# Patient Record
Sex: Female | Born: 1965 | Race: White | Hispanic: No | Marital: Married | Smoking: Current every day smoker
Health system: Southern US, Community
[De-identification: ages and names within clinical notes are randomized; demographics above are authoritative.]

## PROBLEM LIST (undated history)

## (undated) DIAGNOSIS — M549 Dorsalgia, unspecified: Secondary | ICD-10-CM

---

## 2014-11-19 ENCOUNTER — Observation Stay (HOSPITAL_BASED_OUTPATIENT_CLINIC_OR_DEPARTMENT_OTHER)
Admission: EM | Admit: 2014-11-19 | Discharge: 2014-11-21 | Disposition: A | Discharge status: Home / Self Care | Payer: Medicaid Other | Attending: General Surgery | Admitting: General Surgery

## 2014-11-19 ENCOUNTER — Emergency Department (HOSPITAL_BASED_OUTPATIENT_CLINIC_OR_DEPARTMENT_OTHER): Payer: Medicaid Other

## 2014-11-19 ENCOUNTER — Encounter (HOSPITAL_BASED_OUTPATIENT_CLINIC_OR_DEPARTMENT_OTHER): Payer: Self-pay | Admitting: Emergency Medicine

## 2014-11-19 DIAGNOSIS — R52 Pain, unspecified: Secondary | ICD-10-CM

## 2014-11-19 DIAGNOSIS — R1011 Right upper quadrant pain: Secondary | ICD-10-CM | POA: Diagnosis present

## 2014-11-19 DIAGNOSIS — K8010 Calculus of gallbladder with chronic cholecystitis without obstruction: Secondary | ICD-10-CM | POA: Diagnosis present

## 2014-11-19 DIAGNOSIS — K8020 Calculus of gallbladder without cholecystitis without obstruction: Secondary | ICD-10-CM

## 2014-11-19 DIAGNOSIS — F17210 Nicotine dependence, cigarettes, uncomplicated: Secondary | ICD-10-CM | POA: Insufficient documentation

## 2014-11-19 DIAGNOSIS — K819 Cholecystitis, unspecified: Secondary | ICD-10-CM

## 2014-11-19 HISTORY — DX: Dorsalgia, unspecified: M54.9

## 2014-11-19 LAB — PREGNANCY, URINE: Preg Test, Ur: NEGATIVE

## 2014-11-19 LAB — COMPREHENSIVE METABOLIC PANEL
ALBUMIN: 4.1 g/dL (ref 3.5–5.0)
ALK PHOS: 66 U/L (ref 38–126)
ALT: 19 U/L (ref 14–54)
ANION GAP: 8 (ref 5–15)
AST: 23 U/L (ref 15–41)
BUN: 6 mg/dL (ref 6–20)
CALCIUM: 9.3 mg/dL (ref 8.9–10.3)
CHLORIDE: 103 mmol/L (ref 101–111)
CO2: 26 mmol/L (ref 22–32)
CREATININE: 0.47 mg/dL (ref 0.44–1.00)
GFR calc non Af Amer: >60 mL/min (ref >60)
Glucose, Bld: 107 mg/dL — ABNORMAL HIGH (ref 65–99)
Potassium: 3.6 mmol/L (ref 3.5–5.1)
SODIUM: 137 mmol/L (ref 135–145)
Total Bilirubin: 0.6 mg/dL (ref 0.3–1.2)
Total Protein: 8.2 g/dL — ABNORMAL HIGH (ref 6.5–8.1)

## 2014-11-19 LAB — URINALYSIS, ROUTINE W REFLEX MICROSCOPIC
Bilirubin Urine: NEGATIVE
GLUCOSE, UA: NEGATIVE mg/dL
HGB URINE DIPSTICK: NEGATIVE
Ketones, ur: 15 mg/dL — AB
LEUKOCYTES UA: NEGATIVE
Nitrite: NEGATIVE
PH: 7.5 (ref 5.0–8.0)
Protein, ur: NEGATIVE mg/dL
Specific Gravity, Urine: 1.017 (ref 1.005–1.030)
Urobilinogen, UA: 1 mg/dL (ref 0.0–1.0)

## 2014-11-19 LAB — CBC
HCT: 40.8 % (ref 36.0–46.0)
HEMOGLOBIN: 13.7 g/dL (ref 12.0–15.0)
MCH: 31 pg (ref 26.0–34.0)
MCHC: 33.6 g/dL (ref 30.0–36.0)
MCV: 92.3 fL (ref 78.0–100.0)
Platelets: 371 10*3/uL (ref 150–400)
RBC: 4.42 MIL/uL (ref 3.87–5.11)
RDW: 12.8 % (ref 11.5–15.5)
WBC: 19.4 10*3/uL — ABNORMAL HIGH (ref 4.0–10.5)

## 2014-11-19 LAB — LIPASE, BLOOD: LIPASE: 18 U/L (ref 11–51)

## 2014-11-19 MED ORDER — KCL IN DEXTROSE-NACL 20-5-0.9 MEQ/L-%-% IV SOLN
INTRAVENOUS | Status: DC
  Administered 2014-11-19 – 2014-11-20 (×2): 100 mL/h via INTRAVENOUS
  Filled 2014-11-19 (×5): qty 1000

## 2014-11-19 MED ORDER — PANTOPRAZOLE SODIUM 40 MG IV SOLR
40.0000 mg | Freq: Every day | INTRAVENOUS | Status: DC
  Administered 2014-11-20 (×2): 40 mg via INTRAVENOUS
  Filled 2014-11-19 (×3): qty 40

## 2014-11-19 MED ORDER — HYDROMORPHONE HCL 1 MG/ML IJ SOLN
1.0000 mg | Freq: Once | INTRAMUSCULAR | Status: AC
  Administered 2014-11-19: 1 mg via INTRAVENOUS
  Filled 2014-11-19: qty 1

## 2014-11-19 MED ORDER — MORPHINE SULFATE (PF) 2 MG/ML IV SOLN
1.0000 mg | INTRAVENOUS | Status: DC | PRN
  Administered 2014-11-19: 2 mg via INTRAVENOUS
  Administered 2014-11-20: 1 mg via INTRAVENOUS
  Administered 2014-11-20: 2 mg via INTRAVENOUS
  Administered 2014-11-21 (×2): 1 mg via INTRAVENOUS
  Filled 2014-11-19 (×5): qty 1

## 2014-11-19 MED ORDER — PIPERACILLIN-TAZOBACTAM 3.375 G IVPB
INTRAVENOUS | Status: AC
  Filled 2014-11-19: qty 50

## 2014-11-19 MED ORDER — ONDANSETRON 4 MG PO TBDP: 4.0000 mg | ORAL_TABLET | Freq: Four times a day (QID) | ORAL | Status: DC | PRN

## 2014-11-19 MED ORDER — METRONIDAZOLE IN NACL 5-0.79 MG/ML-% IV SOLN
500.0000 mg | Freq: Once | INTRAVENOUS | Status: DC
  Filled 2014-11-19: qty 100

## 2014-11-19 MED ORDER — PIPERACILLIN-TAZOBACTAM 4.5 G IVPB
3.3750 g | Freq: Once | INTRAVENOUS | Status: AC
  Administered 2014-11-19: 3.375 g via INTRAVENOUS
  Filled 2014-11-19: qty 100

## 2014-11-19 MED ORDER — IOHEXOL 350 MG/ML SOLN
100.0000 mL | Freq: Once | INTRAVENOUS | Status: AC | PRN
  Administered 2014-11-19: 100 mL via INTRAVENOUS

## 2014-11-19 MED ORDER — PIPERACILLIN-TAZOBACTAM 4.5 G IVPB
4.5000 g | Freq: Once | INTRAVENOUS | Status: DC
  Filled 2014-11-19: qty 100

## 2014-11-19 MED ORDER — ONDANSETRON HCL 4 MG/2ML IJ SOLN
4.0000 mg | Freq: Once | INTRAMUSCULAR | Status: AC | PRN
  Administered 2014-11-19: 4 mg via INTRAVENOUS
  Filled 2014-11-19: qty 2

## 2014-11-19 MED ORDER — PIPERACILLIN-TAZOBACTAM 3.375 G IVPB
3.3750 g | Freq: Three times a day (TID) | INTRAVENOUS | Status: DC
  Administered 2014-11-20 – 2014-11-21 (×3): 3.375 g via INTRAVENOUS
  Filled 2014-11-19 (×5): qty 50

## 2014-11-19 MED ORDER — ONDANSETRON HCL 4 MG/2ML IJ SOLN
4.0000 mg | Freq: Once | INTRAMUSCULAR | Status: DC
  Filled 2014-11-19: qty 2

## 2014-11-19 MED ORDER — PROMETHAZINE HCL 25 MG/ML IJ SOLN
12.5000 mg | Freq: Once | INTRAMUSCULAR | Status: AC
  Administered 2014-11-19: 12.5 mg via INTRAVENOUS
  Filled 2014-11-19: qty 1

## 2014-11-19 MED ORDER — ONDANSETRON HCL 4 MG/2ML IJ SOLN: 4.0000 mg | Freq: Four times a day (QID) | INTRAMUSCULAR | Status: DC | PRN

## 2014-11-19 MED ORDER — MORPHINE SULFATE (PF) 4 MG/ML IV SOLN
4.0000 mg | Freq: Once | INTRAVENOUS | Status: AC
  Administered 2014-11-19: 4 mg via INTRAVENOUS
  Filled 2014-11-19: qty 1

## 2014-11-19 MED ORDER — SODIUM CHLORIDE 0.9 % IV BOLUS (SEPSIS)
1000.0000 mL | Freq: Once | INTRAVENOUS | Status: AC
  Administered 2014-11-19: 1000 mL via INTRAVENOUS

## 2014-11-19 MED ORDER — CIPROFLOXACIN IN D5W 400 MG/200ML IV SOLN
400.0000 mg | Freq: Two times a day (BID) | INTRAVENOUS | Status: DC
  Filled 2014-11-19: qty 200

## 2014-11-19 NOTE — ED Notes (Signed)
Pt reports acute onset of abdominal pain that started last night, states she has vomited x 10 last bm yesterday am

## 2014-11-19 NOTE — ED Notes (Signed)
Called pt's husband Claud at [phone] to update him on pt's transfer and where to go once he gets to [facility].

## 2014-11-19 NOTE — H&P (Signed)
Janice Edwards is an 49 y.o. female.   Chief Complaint: abdominal pain HPI:  The patient is a 49 year old white female who presents with a 1 day of abdominal pain. The pain is located in the right upper quadrant and epigastric region. The pain has been constant. The pain has been associated with significant nausea and vomiting. She went to the emergency department where a CT and ultrasound showed a 2 cm stone in the gallbladder neck with changes of cholecystitis. She does smoke.  Past Medical History  Diagnosis Date  . Back pain     History reviewed. No pertinent past surgical history.  History reviewed. No pertinent family history. Social History:  reports that she has been smoking Cigarettes.  She has been smoking about 1.00 pack per day. She does not have any smokeless tobacco history on file. She reports that she does not drink alcohol. Her drug history is not on file.  Allergies: No Known Allergies   (Not in a hospital admission)  Results for orders placed or performed during the hospital encounter of 11/19/14 (from the past 48 hour(s))  Urinalysis, Routine w reflex microscopic (not at [facility])     Status: Abnormal   Collection Time: 11/19/14 12:20 PM  Result Value Ref Range   Color, Urine YELLOW YELLOW   APPearance CLOUDY (A) CLEAR   Specific Gravity, Urine 1.017 1.005 - 1.030   pH 7.5 5.0 - 8.0   Glucose, UA NEGATIVE NEGATIVE mg/dL   Hgb urine dipstick NEGATIVE NEGATIVE   Bilirubin Urine NEGATIVE NEGATIVE   Ketones, ur 15 (A) NEGATIVE mg/dL   Protein, ur NEGATIVE NEGATIVE mg/dL   Urobilinogen, UA 1.0 0.0 - 1.0 mg/dL   Nitrite NEGATIVE NEGATIVE   Leukocytes, UA NEGATIVE NEGATIVE    Comment: MICROSCOPIC NOT DONE ON URINES WITH NEGATIVE PROTEIN, BLOOD, LEUKOCYTES, NITRITE, OR GLUCOSE <1000 mg/dL.  Pregnancy, urine     Status: None   Collection Time: 11/19/14 12:20 PM  Result Value Ref Range   Preg Test, Ur NEGATIVE NEGATIVE    Comment:        THE SENSITIVITY OF  THIS METHODOLOGY IS >20 mIU/mL.   Lipase, blood     Status: None   Collection Time: 11/19/14  1:52 PM  Result Value Ref Range   Lipase 18 11 - 51 U/L    Comment: Please note change in reference range.  Comprehensive metabolic panel     Status: Abnormal   Collection Time: 11/19/14  1:52 PM  Result Value Ref Range   Sodium 137 135 - 145 mmol/L   Potassium 3.6 3.5 - 5.1 mmol/L   Chloride 103 101 - 111 mmol/L   CO2 26 22 - 32 mmol/L   Glucose, Bld 107 (H) 65 - 99 mg/dL   BUN 6 6 - 20 mg/dL   Creatinine, Ser 0.47 0.44 - 1.00 mg/dL   Calcium 9.3 8.9 - 10.3 mg/dL   Total Protein 8.2 (H) 6.5 - 8.1 g/dL   Albumin 4.1 3.5 - 5.0 g/dL   AST 23 15 - 41 U/L   ALT 19 14 - 54 U/L   Alkaline Phosphatase 66 38 - 126 U/L   Total Bilirubin 0.6 0.3 - 1.2 mg/dL   GFR calc non Af Amer >60 >60 mL/min   GFR calc Af Amer >60 >60 mL/min    Comment: (NOTE) The eGFR has been calculated using the CKD EPI equation. This calculation has not been validated in all clinical situations. eGFR's persistently <60 mL/min signify possible Chronic  Kidney Disease.    Anion gap 8 5 - 15  CBC     Status: Abnormal   Collection Time: 11/19/14  1:52 PM  Result Value Ref Range   WBC 19.4 (H) 4.0 - 10.5 K/uL   RBC 4.42 3.87 - 5.11 MIL/uL   Hemoglobin 13.7 12.0 - 15.0 g/dL   HCT 40.8 36.0 - 46.0 %   MCV 92.3 78.0 - 100.0 fL   MCH 31.0 26.0 - 34.0 pg   MCHC 33.6 30.0 - 36.0 g/dL   RDW 12.8 11.5 - 15.5 %   Platelets 371 150 - 400 K/uL   US Abdomen Complete  11/19/2014  CLINICAL DATA:  Epigastric pain radiating to right upper quadrant for 1 day with nausea and vomiting. EXAM: ULTRASOUND ABDOMEN COMPLETE COMPARISON:  CT from earlier same day. FINDINGS: Gallbladder: There is a large gallstone, measuring 2 cm greatest dimension, that appears to be fixed in the gallbladder neck region. Gallbladder is distended and there is pericholecystic edema and gallbladder wall thickening indicating acute cholecystitis. Sonographic  Murphy's sign was elicited by the sonographer during the exam. Common bile duct: Diameter: Common bile duct not well seen. Common bile duct caliber is normal where seen at 4 mm. Liver: Liver is diffusely echogenic consistent with fatty infiltration. No focal mass or lesion identified within the liver. Main portal vein is shown to be patent with appropriate hepatopetal directional blood flow. IVC: No abnormality visualized. Pancreas: Limited visualization.  Pancreatic head appears normal. Spleen: Size and appearance within normal limits. Right Kidney: Length: 9.8 cm. Echogenicity within normal limits. No mass or hydronephrosis visualized. Left Kidney: Length: 10.2 cm. Echogenicity within normal limits. No mass or hydronephrosis visualized. Abdominal aorta: Limited visualization due to overlying bowel gas. Normal in caliber were seen. Other findings: None. IMPRESSION: 1. Findings consistent with acute cholecystitis. 2 cm gallstone fixed in the gallbladder neck region. Gallbladder is distended and there is gallbladder wall thickening and pericholecystic edema. 2. Fatty infiltration of the liver. 3. Limited visualization the pancreas and abdominal aorta. These results were called by telephone at the time of interpretation on 11/19/2014 at 6:27 pm to Dr. Donnald Garre , who verbally acknowledged these results. Electronically Signed   By: Franki Cabot M.D.   On: 11/19/2014 18:27   Ct Angio Chest Aorta W/cm &/or Wo/cm  11/19/2014  CLINICAL DATA:  Sharp epigastric abdominal and back pain. EXAM: CT ANGIOGRAPHY CHEST, ABDOMEN AND PELVIS TECHNIQUE: Multidetector CT imaging through the chest, abdomen and pelvis was performed using the standard protocol during bolus administration of intravenous contrast. Multiplanar reconstructed images and MIPs were obtained and reviewed to evaluate the vascular anatomy. CONTRAST:  171m OMNIPAQUE IOHEXOL 350 MG/ML SOLN COMPARISON:  None. FINDINGS: CTA CHEST FINDINGS No pneumothorax or  pleural effusion is noted. No acute pulmonary disease is noted. There is no evidence of thoracic aortic dissection or aneurysm. Great vessels are widely patent. There is no definite evidence of pulmonary embolus. No mediastinal mass or adenopathy is noted. No significant osseous abnormality is noted. Review of the MIP images confirms the above findings. CTA ABDOMEN AND PELVIS FINDINGS Fatty infiltration of the liver is noted with sparing around the gallbladder fossa. Large solitary gallstone is noted in neck of gallbladder. The spleen and pancreas appear normal. Adrenal glands and kidneys appear normal. No hydronephrosis or renal obstruction is noted. There is no evidence of bowel obstruction. There is no evidence of abdominal aortic aneurysm or dissection. Mesenteric and renal arteries are widely patent. Visualized iliac arteries  are patent. No abnormal fluid collection is noted. Urinary bladder appears normal. Uterus and ovaries appear normal. No significant adenopathy is noted. Review of the MIP images confirms the above findings. IMPRESSION: No evidence of thoracic or abdominal aortic aneurysm or dissection. Fatty infiltration of the liver is noted. Large solitary gallstone is noted in the neck of the gallbladder. Electronically Signed   By: Marijo Conception, M.D.   On: 11/19/2014 17:02   Ct Angio Abd/pel W/ And/or W/o  11/19/2014  CLINICAL DATA:  Sharp epigastric abdominal and back pain. EXAM: CT ANGIOGRAPHY CHEST, ABDOMEN AND PELVIS TECHNIQUE: Multidetector CT imaging through the chest, abdomen and pelvis was performed using the standard protocol during bolus administration of intravenous contrast. Multiplanar reconstructed images and MIPs were obtained and reviewed to evaluate the vascular anatomy. CONTRAST:  168m OMNIPAQUE IOHEXOL 350 MG/ML SOLN COMPARISON:  None. FINDINGS: CTA CHEST FINDINGS No pneumothorax or pleural effusion is noted. No acute pulmonary disease is noted. There is no evidence of  thoracic aortic dissection or aneurysm. Great vessels are widely patent. There is no definite evidence of pulmonary embolus. No mediastinal mass or adenopathy is noted. No significant osseous abnormality is noted. Review of the MIP images confirms the above findings. CTA ABDOMEN AND PELVIS FINDINGS Fatty infiltration of the liver is noted with sparing around the gallbladder fossa. Large solitary gallstone is noted in neck of gallbladder. The spleen and pancreas appear normal. Adrenal glands and kidneys appear normal. No hydronephrosis or renal obstruction is noted. There is no evidence of bowel obstruction. There is no evidence of abdominal aortic aneurysm or dissection. Mesenteric and renal arteries are widely patent. Visualized iliac arteries are patent. No abnormal fluid collection is noted. Urinary bladder appears normal. Uterus and ovaries appear normal. No significant adenopathy is noted. Review of the MIP images confirms the above findings. IMPRESSION: No evidence of thoracic or abdominal aortic aneurysm or dissection. Fatty infiltration of the liver is noted. Large solitary gallstone is noted in the neck of the gallbladder. Electronically Signed   By: JMarijo Conception M.D.   On: 11/19/2014 17:02    Review of Systems  Constitutional: Negative.   HENT: Negative.   Eyes: Negative.   Respiratory: Negative.   Cardiovascular: Negative.   Gastrointestinal: Positive for nausea, vomiting and abdominal pain.  Genitourinary: Negative.   Musculoskeletal: Positive for back pain.  Skin: Negative.   Neurological: Negative.   Endo/Heme/Allergies: Negative.   Psychiatric/Behavioral: Negative.     Blood pressure 103/58, pulse 66, temperature 98.6 F (37 C), temperature source Oral, resp. rate 20, height '5\' 6"'  (1.676 m), weight 54.885 kg (121 lb), SpO2 94 %. Physical Exam  Constitutional: She is oriented to person, place, and time. She appears well-developed and well-nourished.  HENT:  Head:  Normocephalic and atraumatic.  Eyes: Conjunctivae and EOM are normal. Pupils are equal, round, and reactive to light.  Neck: Normal range of motion. Neck supple.  Cardiovascular: Normal rate, regular rhythm and normal heart sounds.   Respiratory: Effort normal and breath sounds normal.  GI: Soft.  There is RUQ and epigastric tenderness  Musculoskeletal: Normal range of motion.  Neurological: She is alert and oriented to person, place, and time.  Skin: Skin is warm and dry.  Psychiatric: She has a normal mood and affect. Her behavior is normal.     Assessment/Plan  The patient appears to have symptomatic cholelithiasis with cholecystitis. Because of the risk of further painful episodes and possible pancreatitis setting she would benefit from having  her gallbladder removed. I have discussed with her in detail the risks and benefits of the operation to remove the gallbladder as well as some of the technical aspects including the risk of injury to the common bile duct and she understands and wishes to proceed  TOTH III,PAUL S 11/19/2014, 9:09 PM

## 2014-11-19 NOTE — ED Notes (Signed)
Attempted to call [facility] to get medication list, they closed at 1300. Unable to obtain medications patient is taking

## 2014-11-19 NOTE — ED Notes (Signed)
Pt vomited x2, RN notified

## 2014-11-19 NOTE — Progress Notes (Signed)
ANTIBIOTIC CONSULT NOTE - INITIAL  Pharmacy Consult for cipro Indication: acute cholecystitis  No Known Allergies  Patient Measurements: Height: 5\' 6"  (167.6 cm) Weight: 121 lb (54.885 kg) IBW/kg (Calculated) : 59.3 Adjusted Body Weight:   Vital Signs: Temp: 98.4 F (36.9 C) (10/22 1223) Temp Source: Oral (10/22 1223) BP: 100/54 mmHg (10/22 1730) Pulse Rate: 90 (10/22 1730) Intake/Output from previous day:   Intake/Output from this shift:    Labs:  Recent Labs  11/19/14 1352  WBC 19.4*  HGB 13.7  PLT 371  CREATININE 0.47   Estimated Creatinine Clearance: 76.2 mL/min (by C-G formula based on Cr of 0.47). No results for input(s): VANCOTROUGH, VANCOPEAK, VANCORANDOM, GENTTROUGH, GENTPEAK, GENTRANDOM, TOBRATROUGH, TOBRAPEAK, TOBRARND, AMIKACINPEAK, AMIKACINTROU, AMIKACIN in the last 72 hours.   Microbiology: No results found for this or any previous visit (from the past 720 hour(s)).  Medical History: Past Medical History  Diagnosis Date  . Back pain     Medications:  Scheduled:   Infusions:  . metronidazole     Assessment: 49 yo female with acute cholecystitis will be started on cipro.  CrCl 76  Goal of Therapy:  Resolution of infection  Plan:  - cipro 400 mg iv q12h  Ikechukwu Cerny, Tsz-Yin 11/19/2014,6:34 PM

## 2014-11-19 NOTE — ED Provider Notes (Signed)
CSN: [id number]     Arrival date & time 11/19/14  1219 History   First MD Initiated Contact with Patient 11/19/14 1334     Chief Complaint  Patient presents with  . Abdominal Pain     (Consider location/radiation/quality/duration/timing/severity/associated sxs/prior Treatment) HPI  Janice Edwards is a 49 y.o F with a pmhx of chronic back pain who presents the emergency department today complaining of upper abdominal pain radiating to her back onset yesterday. Patient states that she was resting on her couch yesterday when she felt sudden onset back pain in her thoracic region that is now traveled to her upper abdomen including right upper and left upper quadrant. Patient also complaining of nausea and several episodes of emesis since since last night, NBNB. Associated symptoms include chills, sweating. Patient also complaining of a "funny sensation in her feet". No aggravating or alleviating factors. Denies numbness, weakness, fever. Denies alcohol use.  Past Medical History  Diagnosis Date  . Back pain    History reviewed. No pertinent past surgical history. History reviewed. No pertinent family history. Social History  Substance Use Topics  . Smoking status: Current Every Day Smoker -- 1.00 packs/day    Types: Cigarettes  . Smokeless tobacco: None  . Alcohol Use: No   OB History    No data available     Review of Systems  All other systems reviewed and are negative.     Allergies  Review of patient's allergies indicates no known allergies.  Home Medications   Prior to Admission medications   Not on File   BP 100/54 mmHg  Pulse 90  Temp(Src) 98.4 F (36.9 C) (Oral)  Resp 20  Ht  (1.676 m)  Wt 121 lb (54.885 kg)  BMI 19.54 kg/m2  SpO2 95% Physical Exam  Constitutional: She is oriented to person, place, and time. She appears well-developed and well-nourished. She appears distressed.  Ill-appearing female lying in bed. Patient is trembling. Complaining of  pain in her abdomen.  HENT:  Head: Normocephalic and atraumatic.  Mouth/Throat: Oropharynx is clear and moist. No oropharyngeal exudate.  Eyes: Conjunctivae and EOM are normal. Pupils are equal, round, and reactive to light. Right eye exhibits no discharge. Left eye exhibits no discharge. No scleral icterus.  Neck: Normal range of motion. Neck supple.  Cardiovascular: Normal rate, regular rhythm, normal heart sounds and intact distal pulses.  Exam reveals no gallop and no friction rub.   No murmur heard. Pulmonary/Chest: Effort normal and breath sounds normal. No respiratory distress. She has no wheezes. She has no rales. She exhibits no tenderness.  Abdominal: Soft. Bowel sounds are normal. She exhibits no distension. There is tenderness. There is guarding.  Exquisite tenderness to palpation of right upper quadrant, epigastrium and left upper quadrant. Positive Murphy sign.  Musculoskeletal: Normal range of motion. She exhibits no edema or tenderness.  Lymphadenopathy:    She has no cervical adenopathy.  Neurological: She is alert and oriented to person, place, and time. No cranial nerve deficit.  Strength 5/5 throughout. No sensory deficits.  No gait abnormal.  Skin: Skin is warm. No rash noted. She is diaphoretic. No erythema. No pallor.  Psychiatric: She has a normal mood and affect.  Nursing note and vitals reviewed.   ED Course  Procedures (including critical care time) Labs Review Labs Reviewed  URINALYSIS, ROUTINE W REFLEX MICROSCOPIC (NOT AT [facility]) - Abnormal; Notable for the following:    APPearance CLOUDY (*)    Ketones, ur 15 (*)  All other components within normal limits  COMPREHENSIVE METABOLIC PANEL - Abnormal; Notable for the following:    Glucose, Bld 107 (*)    Total Protein 8.2 (*)    All other components within normal limits  CBC - Abnormal; Notable for the following:    WBC 19.4 (*)    All other components within normal limits  LIPASE, BLOOD  PREGNANCY,  URINE    Imaging Review US Abdomen Complete  11/19/2014  CLINICAL DATA:  Epigastric pain radiating to right upper quadrant for 1 day with nausea and vomiting. EXAM: ULTRASOUND ABDOMEN COMPLETE COMPARISON:  CT from earlier same day. FINDINGS: Gallbladder: There is a large gallstone, measuring 2 cm greatest dimension, that appears to be fixed in the gallbladder neck region. Gallbladder is distended and there is pericholecystic edema and gallbladder wall thickening indicating acute cholecystitis. Sonographic Murphy's sign was elicited by the sonographer during the exam. Common bile duct: Diameter: Common bile duct not well seen. Common bile duct caliber is normal where seen at 4 mm. Liver: Liver is diffusely echogenic consistent with fatty infiltration. No focal mass or lesion identified within the liver. Main portal vein is shown to be patent with appropriate hepatopetal directional blood flow. IVC: No abnormality visualized. Pancreas: Limited visualization.  Pancreatic head appears normal. Spleen: Size and appearance within normal limits. Right Kidney: Length: 9.8 cm. Echogenicity within normal limits. No mass or hydronephrosis visualized. Left Kidney: Length: 10.2 cm. Echogenicity within normal limits. No mass or hydronephrosis visualized. Abdominal aorta: Limited visualization due to overlying bowel gas. Normal in caliber were seen. Other findings: None. IMPRESSION: 1. Findings consistent with acute cholecystitis. 2 cm gallstone fixed in the gallbladder neck region. Gallbladder is distended and there is gallbladder wall thickening and pericholecystic edema. 2. Fatty infiltration of the liver. 3. Limited visualization the pancreas and abdominal aorta. These results were called by telephone at the time of interpretation on 11/19/2014 at 6:27 pm to Dr. Gaylyn Rong , who verbally acknowledged these results. Electronically Signed   By: Bary Richard M.D.   On: 11/19/2014 18:27   Ct Angio Chest Aorta W/cm  &/or Wo/cm  11/19/2014  CLINICAL DATA:  Sharp epigastric abdominal and back pain. EXAM: CT ANGIOGRAPHY CHEST, ABDOMEN AND PELVIS TECHNIQUE: Multidetector CT imaging through the chest, abdomen and pelvis was performed using the standard protocol during bolus administration of intravenous contrast. Multiplanar reconstructed images and MIPs were obtained and reviewed to evaluate the vascular anatomy. CONTRAST:  OMNIPAQUE IOHEXOL 350 MG/ML SOLN COMPARISON:  None. FINDINGS: CTA CHEST FINDINGS No pneumothorax or pleural effusion is noted. No acute pulmonary disease is noted. There is no evidence of thoracic aortic dissection or aneurysm. Great vessels are widely patent. There is no definite evidence of pulmonary embolus. No mediastinal mass or adenopathy is noted. No significant osseous abnormality is noted. Review of the MIP images confirms the above findings. CTA ABDOMEN AND PELVIS FINDINGS Fatty infiltration of the liver is noted with sparing around the gallbladder fossa. Large solitary gallstone is noted in neck of gallbladder. The spleen and pancreas appear normal. Adrenal glands and kidneys appear normal. No hydronephrosis or renal obstruction is noted. There is no evidence of bowel obstruction. There is no evidence of abdominal aortic aneurysm or dissection. Mesenteric and renal arteries are widely patent. Visualized iliac arteries are patent. No abnormal fluid collection is noted. Urinary bladder appears normal. Uterus and ovaries appear normal. No significant adenopathy is noted. Review of the MIP images confirms the above findings. IMPRESSION: No evidence  of thoracic or abdominal aortic aneurysm or dissection. Fatty infiltration of the liver is noted. Large solitary gallstone is noted in the neck of the gallbladder. Electronically Signed   By: Lupita RaiderJames  Green Jr, M.D.   On: 11/19/2014 17:02   Ct Angio Abd/pel W/ And/or W/o  11/19/2014  CLINICAL DATA:  Sharp epigastric abdominal and back pain. EXAM: CT  ANGIOGRAPHY CHEST, ABDOMEN AND PELVIS TECHNIQUE: Multidetector CT imaging through the chest, abdomen and pelvis was performed using the standard protocol during bolus administration of intravenous contrast. Multiplanar reconstructed images and MIPs were obtained and reviewed to evaluate the vascular anatomy. CONTRAST:  100mL OMNIPAQUE IOHEXOL 350 MG/ML SOLN COMPARISON:  None. FINDINGS: CTA CHEST FINDINGS No pneumothorax or pleural effusion is noted. No acute pulmonary disease is noted. There is no evidence of thoracic aortic dissection or aneurysm. Great vessels are widely patent. There is no definite evidence of pulmonary embolus. No mediastinal mass or adenopathy is noted. No significant osseous abnormality is noted. Review of the MIP images confirms the above findings. CTA ABDOMEN AND PELVIS FINDINGS Fatty infiltration of the liver is noted with sparing around the gallbladder fossa. Large solitary gallstone is noted in neck of gallbladder. The spleen and pancreas appear normal. Adrenal glands and kidneys appear normal. No hydronephrosis or renal obstruction is noted. There is no evidence of bowel obstruction. There is no evidence of abdominal aortic aneurysm or dissection. Mesenteric and renal arteries are widely patent. Visualized iliac arteries are patent. No abnormal fluid collection is noted. Urinary bladder appears normal. Uterus and ovaries appear normal. No significant adenopathy is noted. Review of the MIP images confirms the above findings. IMPRESSION: No evidence of thoracic or abdominal aortic aneurysm or dissection. Fatty infiltration of the liver is noted. Large solitary gallstone is noted in the neck of the gallbladder. Electronically Signed   By: Lupita RaiderJames  Green Jr, M.D.   On: 11/19/2014 17:02   I have personally reviewed and evaluated these images and lab results as part of my medical decision-making.   EKG Interpretation None      MDM   Final diagnoses:  Cholecystitis    Otherwise  healthy 49 year old female complaining of acute onset back pain that has radiated to her upper abdomen. Afebrile. Exquisitely tender abdominal exam. Patient is trembling. Due to nature of pain and appearance of patient concern for abdominal aortic dissection. We will order a CT angiogram.  Patient given IV fluids and pain medication as well as nausea medication.  CT angiogram reveals no dissection however there is a large solitary gallstone in the neck of the gallbladder. Right upper quadrant ultrasound ordered  Blood labs have not resulted. WBC count 19,000.   Right upper quadrant ultrasound reveals a 2 cm gallstone fixed in the gallbladder neck region gallbladder is distended with wall thickening and pericholecystic edema. Consistent with acute cholecystitis.  Liver function within normal limits bilirubin is not elevated. No jaundice.  Spoke with general surgery [facility]. Recommend [facility] department and patient will be accepted by the general surgeon for cholecystectomy. Recommend IV Zosyn.  Sp[facility] with emergency physician [facility] Adri[facility] Simasook [facility] who [facility] patient.  Arrange transfer with [facility].  Patient was discussed with and seen by Dr. Deretha EmoryZackowski as well as Dr. Loretha StaplerWofford who agrees with the treatment plan.      Lester KinsmanSamantha Tripp [place] [facility], PA-C 11/19/14 1947  Blake DivineJohn Wofford, MD 11/20/14 [phone]

## 2014-11-19 NOTE — ED Provider Notes (Signed)
Medical screening examination/treatment/procedure(s) were conducted as a shared visit with non-physician practitioner(s) and myself.  I personally evaluated the patient during the encounter.   EKG Interpretation None      Results for orders placed or performed during the hospital encounter of 11/19/14  Urinalysis, Routine w reflex microscopic (not at [facility])  Result Value Ref Range   Color, Urine YELLOW YELLOW   APPearance CLOUDY (A) CLEAR   Specific Gravity, Urine 1.017 1.005 - 1.030   pH 7.5 5.0 - 8.0   Glucose, UA NEGATIVE NEGATIVE mg/dL   Hgb urine dipstick NEGATIVE NEGATIVE   Bilirubin Urine NEGATIVE NEGATIVE   Ketones, ur 15 (A) NEGATIVE mg/dL   Protein, ur NEGATIVE NEGATIVE mg/dL   Urobilinogen, UA 1.0 0.0 - 1.0 mg/dL   Nitrite NEGATIVE NEGATIVE   Leukocytes, UA NEGATIVE NEGATIVE  Lipase, blood  Result Value Ref Range   Lipase 18 11 - 51 U/L  Comprehensive metabolic panel  Result Value Ref Range   Sodium 137 135 - 145 mmol/L   Potassium 3.6 3.5 - 5.1 mmol/L   Chloride 103 101 - 111 mmol/L   CO2 26 22 - 32 mmol/L   Glucose, Bld 107 (H) 65 - 99 mg/dL   BUN 6 6 - 20 mg/dL   Creatinine, Ser 1.61 0.44 - 1.00 mg/dL   Calcium 9.3 8.9 - 09.6 mg/dL   Total Protein 8.2 (H) 6.5 - 8.1 g/dL   Albumin 4.1 3.5 - 5.0 g/dL   AST 23 15 - 41 U/L   ALT 19 14 - 54 U/L   Alkaline Phosphatase 66 38 - 126 U/L   Total Bilirubin 0.6 0.3 - 1.2 mg/dL   GFR calc non Af Amer >60 >60 mL/min   GFR calc Af Amer >60 >60 mL/min   Anion gap 8 5 - 15  CBC  Result Value Ref Range   WBC 19.4 (H) 4.0 - 10.5 K/uL   RBC 4.42 3.87 - 5.11 MIL/uL   Hemoglobin 13.7 12.0 - 15.0 g/dL   HCT 04.5 40.9 - 81.1 %   MCV 92.3 78.0 - 100.0 fL   MCH 31.0 26.0 - 34.0 pg   MCHC 33.6 30.0 - 36.0 g/dL   RDW 91.4 78.2 - 95.6 %   Platelets 371 150 - 400 K/uL  Pregnancy, urine  Result Value Ref Range   Preg Test, Ur NEGATIVE NEGATIVE   US Abdomen Complete  11/19/2014  CLINICAL DATA:  Epigastric pain radiating to  right upper quadrant for 1 day with nausea and vomiting. EXAM: ULTRASOUND ABDOMEN COMPLETE COMPARISON:  CT from earlier same day. FINDINGS: Gallbladder: There is a large gallstone, measuring 2 cm greatest dimension, that appears to be fixed in the gallbladder neck region. Gallbladder is distended and there is pericholecystic edema and gallbladder wall thickening indicating acute cholecystitis. Sonographic Murphy's sign was elicited by the sonographer during the exam. Common bile duct: Diameter: Common bile duct not well seen. Common bile duct caliber is normal where seen at 4 mm. Liver: Liver is diffusely echogenic consistent with fatty infiltration. No focal mass or lesion identified within the liver. Main portal vein is shown to be patent with appropriate hepatopetal directional blood flow. IVC: No abnormality visualized. Pancreas: Limited visualization.  Pancreatic head appears normal. Spleen: Size and appearance within normal limits. Right Kidney: Length: 9.8 cm. Echogenicity within normal limits. No mass or hydronephrosis visualized. Left Kidney: Length: 10.2 cm. Echogenicity within normal limits. No mass or hydronephrosis visualized. Abdominal aorta: Limited visualization due to overlying  bowel gas. Normal in caliber were seen. Other findings: None. IMPRESSION: 1. Findings consistent with acute cholecystitis. 2 cm gallstone fixed in the gallbladder neck region. Gallbladder is distended and there is gallbladder wall thickening and pericholecystic edema. 2. Fatty infiltration of the liver. 3. Limited visualization the pancreas and abdominal aorta. These results were called by telephone at the time of interpretation on 11/19/2014 at 6:27 pm to Dr. Gaylyn RongSAMANTHA DOWLESS , who verbally acknowledged these results. Electronically Signed   By: Bary RichardStan  Maynard M.D.   On: 11/19/2014 18:27   Ct Angio Chest Aorta W/cm &/or Wo/cm  11/19/2014  CLINICAL DATA:  Sharp epigastric abdominal and back pain. EXAM: CT ANGIOGRAPHY  CHEST, ABDOMEN AND PELVIS TECHNIQUE: Multidetector CT imaging through the chest, abdomen and pelvis was performed using the standard protocol during bolus administration of intravenous contrast. Multiplanar reconstructed images and MIPs were obtained and reviewed to evaluate the vascular anatomy. CONTRAST:  100mL OMNIPAQUE IOHEXOL 350 MG/ML SOLN COMPARISON:  None. FINDINGS: CTA CHEST FINDINGS No pneumothorax or pleural effusion is noted. No acute pulmonary disease is noted. There is no evidence of thoracic aortic dissection or aneurysm. Great vessels are widely patent. There is no definite evidence of pulmonary embolus. No mediastinal mass or adenopathy is noted. No significant osseous abnormality is noted. Review of the MIP images confirms the above findings. CTA ABDOMEN AND PELVIS FINDINGS Fatty infiltration of the liver is noted with sparing around the gallbladder fossa. Large solitary gallstone is noted in neck of gallbladder. The spleen and pancreas appear normal. Adrenal glands and kidneys appear normal. No hydronephrosis or renal obstruction is noted. There is no evidence of bowel obstruction. There is no evidence of abdominal aortic aneurysm or dissection. Mesenteric and renal arteries are widely patent. Visualized iliac arteries are patent. No abnormal fluid collection is noted. Urinary bladder appears normal. Uterus and ovaries appear normal. No significant adenopathy is noted. Review of the MIP images confirms the above findings. IMPRESSION: No evidence of thoracic or abdominal aortic aneurysm or dissection. Fatty infiltration of the liver is noted. Large solitary gallstone is noted in the neck of the gallbladder. Electronically Signed   By: Lupita RaiderJames  Green Jr, M.D.   On: 11/19/2014 17:02   Ct Angio Abd/pel W/ And/or W/o  11/19/2014  CLINICAL DATA:  Sharp epigastric abdominal and back pain. EXAM: CT ANGIOGRAPHY CHEST, ABDOMEN AND PELVIS TECHNIQUE: Multidetector CT imaging through the chest, abdomen and  pelvis was performed using the standard protocol during bolus administration of intravenous contrast. Multiplanar reconstructed images and MIPs were obtained and reviewed to evaluate the vascular anatomy. CONTRAST:  100mL OMNIPAQUE IOHEXOL 350 MG/ML SOLN COMPARISON:  None. FINDINGS: CTA CHEST FINDINGS No pneumothorax or pleural effusion is noted. No acute pulmonary disease is noted. There is no evidence of thoracic aortic dissection or aneurysm. Great vessels are widely patent. There is no definite evidence of pulmonary embolus. No mediastinal mass or adenopathy is noted. No significant osseous abnormality is noted. Review of the MIP images confirms the above findings. CTA ABDOMEN AND PELVIS FINDINGS Fatty infiltration of the liver is noted with sparing around the gallbladder fossa. Large solitary gallstone is noted in neck of gallbladder. The spleen and pancreas appear normal. Adrenal glands and kidneys appear normal. No hydronephrosis or renal obstruction is noted. There is no evidence of bowel obstruction. There is no evidence of abdominal aortic aneurysm or dissection. Mesenteric and renal arteries are widely patent. Visualized iliac arteries are patent. No abnormal fluid collection is noted. Urinary bladder appears normal.  Uterus and ovaries appear normal. No significant adenopathy is noted. Review of the MIP images confirms the above findings. IMPRESSION: No evidence of thoracic or abdominal aortic aneurysm or dissection. Fatty infiltration of the liver is noted. Large solitary gallstone is noted in the neck of the gallbladder. Electronically Signed   By: Lupita Raider, M.D.   On: 11/19/2014 17:02    Patient with complaint of epigastric right upper quadrant pain discomfort starting yesterday morning. Radiating to the back. Extensive workup shows evidence of a large stone in the neck of the gallbladder with thickening of the gallbladder wall consistent with cholecystitis. Common bile duct is not well  visualized on the ultrasound. Patient's liver function tests without elevation in lipase or total bilirubin. Neither suggestive of a retained duct stone.  Patient is still complaining of pain is still significant with tender in the right upper quadrant. Patient will require admission to general surgery for acute cholecystitis. Patient with significant leukocytosis. No fevers.  Vanetta Mulders, MD 11/19/14 [phone]

## 2014-11-19 NOTE — ED Notes (Signed)
Bed: [id number] Expected date:  Expected time:  Means of arrival:  Comments: Transfer from med center  Gallstones

## 2014-11-19 NOTE — ED Notes (Signed)
Pt with history of chronic back pain, for which she [facility]'s [facility] [place] [facility], pt states she takes 3 different medications for pain but she is unaware of their names and she is unaware of name of pharmacy that she uses in [place]

## 2014-11-20 ENCOUNTER — Encounter (HOSPITAL_COMMUNITY): Payer: Self-pay | Admitting: *Deleted

## 2014-11-20 ENCOUNTER — Encounter (HOSPITAL_COMMUNITY)
Admission: EM | Disposition: A | Discharge status: Home / Self Care | Payer: Self-pay | Source: Home / Self Care | Attending: Emergency Medicine

## 2014-11-20 ENCOUNTER — Observation Stay (HOSPITAL_COMMUNITY): Payer: Medicaid Other

## 2014-11-20 ENCOUNTER — Observation Stay (HOSPITAL_COMMUNITY): Payer: Medicaid Other | Admitting: Anesthesiology

## 2014-11-20 DIAGNOSIS — F17210 Nicotine dependence, cigarettes, uncomplicated: Secondary | ICD-10-CM | POA: Diagnosis not present

## 2014-11-20 DIAGNOSIS — K8010 Calculus of gallbladder with chronic cholecystitis without obstruction: Secondary | ICD-10-CM | POA: Diagnosis not present

## 2014-11-20 DIAGNOSIS — R1011 Right upper quadrant pain: Secondary | ICD-10-CM | POA: Diagnosis not present

## 2014-11-20 HISTORY — PX: CHOLECYSTECTOMY: SHX55

## 2014-11-20 LAB — SURGICAL PCR SCREEN
MRSA, PCR: NEGATIVE
STAPHYLOCOCCUS AUREUS: NEGATIVE

## 2014-11-20 SURGERY — LAPAROSCOPIC CHOLECYSTECTOMY WITH INTRAOPERATIVE CHOLANGIOGRAM
Anesthesia: General | Site: Abdomen

## 2014-11-20 MED ORDER — MIDAZOLAM HCL 2 MG/2ML IJ SOLN
INTRAMUSCULAR | Status: AC
  Filled 2014-11-20: qty 4

## 2014-11-20 MED ORDER — GLYCOPYRROLATE 0.2 MG/ML IJ SOLN
INTRAMUSCULAR | Status: AC
  Filled 2014-11-20: qty 2

## 2014-11-20 MED ORDER — FENTANYL CITRATE (PF) 100 MCG/2ML IJ SOLN
INTRAMUSCULAR | Status: AC
  Filled 2014-11-20: qty 2

## 2014-11-20 MED ORDER — EPHEDRINE SULFATE 50 MG/ML IJ SOLN
INTRAMUSCULAR | Status: AC
  Filled 2014-11-20: qty 1

## 2014-11-20 MED ORDER — IOHEXOL 300 MG/ML  SOLN
INTRAMUSCULAR | Status: DC | PRN
  Administered 2014-11-20: 5 mL

## 2014-11-20 MED ORDER — ACETAMINOPHEN 10 MG/ML IV SOLN
INTRAVENOUS | Status: DC | PRN
  Administered 2014-11-20: 1000 mg via INTRAVENOUS

## 2014-11-20 MED ORDER — FENTANYL CITRATE (PF) 100 MCG/2ML IJ SOLN
INTRAMUSCULAR | Status: DC | PRN
  Administered 2014-11-20 (×3): 50 ug via INTRAVENOUS

## 2014-11-20 MED ORDER — PROMETHAZINE HCL 25 MG/ML IJ SOLN: 6.2500 mg | INTRAMUSCULAR | Status: DC | PRN

## 2014-11-20 MED ORDER — LACTATED RINGERS IV SOLN
INTRAVENOUS | Status: DC | PRN
  Administered 2014-11-20: 07:00:00 via INTRAVENOUS

## 2014-11-20 MED ORDER — ROCURONIUM BROMIDE 100 MG/10ML IV SOLN
INTRAVENOUS | Status: AC
  Filled 2014-11-20: qty 1

## 2014-11-20 MED ORDER — MIDAZOLAM HCL 5 MG/5ML IJ SOLN
INTRAMUSCULAR | Status: DC | PRN
  Administered 2014-11-20: 2 mg via INTRAVENOUS

## 2014-11-20 MED ORDER — SODIUM CHLORIDE 0.9 % IV SOLN
Freq: Once | INTRAVENOUS | Status: AC
  Administered 2014-11-20: 500 mL/h via INTRAVENOUS

## 2014-11-20 MED ORDER — DEXAMETHASONE SODIUM PHOSPHATE 10 MG/ML IJ SOLN
INTRAMUSCULAR | Status: AC
  Filled 2014-11-20: qty 1

## 2014-11-20 MED ORDER — NEOSTIGMINE METHYLSULFATE 10 MG/10ML IV SOLN
INTRAVENOUS | Status: DC | PRN
  Administered 2014-11-20: 3 mg via INTRAVENOUS

## 2014-11-20 MED ORDER — DEXAMETHASONE SODIUM PHOSPHATE 10 MG/ML IJ SOLN
INTRAMUSCULAR | Status: DC | PRN
  Administered 2014-11-20: 10 mg via INTRAVENOUS

## 2014-11-20 MED ORDER — FENTANYL CITRATE (PF) 250 MCG/5ML IJ SOLN
INTRAMUSCULAR | Status: AC
  Filled 2014-11-20: qty 25

## 2014-11-20 MED ORDER — LACTATED RINGERS IR SOLN
Status: DC | PRN
  Administered 2014-11-20: 1

## 2014-11-20 MED ORDER — PROPOFOL 10 MG/ML IV BOLUS
INTRAVENOUS | Status: DC | PRN
  Administered 2014-11-20: 130 mg via INTRAVENOUS

## 2014-11-20 MED ORDER — LIDOCAINE HCL (CARDIAC) 20 MG/ML IV SOLN
INTRAVENOUS | Status: AC
  Filled 2014-11-20: qty 5

## 2014-11-20 MED ORDER — ONDANSETRON HCL 4 MG/2ML IJ SOLN
INTRAMUSCULAR | Status: DC | PRN
  Administered 2014-11-20: 4 mg via INTRAVENOUS

## 2014-11-20 MED ORDER — GLYCOPYRROLATE 0.2 MG/ML IJ SOLN
INTRAMUSCULAR | Status: DC | PRN
  Administered 2014-11-20: .4 mg via INTRAVENOUS

## 2014-11-20 MED ORDER — FENTANYL CITRATE (PF) 100 MCG/2ML IJ SOLN
25.0000 ug | INTRAMUSCULAR | Status: DC | PRN
  Administered 2014-11-20 (×2): 50 ug via INTRAVENOUS

## 2014-11-20 MED ORDER — ATROPINE SULFATE 0.4 MG/ML IJ SOLN
INTRAMUSCULAR | Status: AC
  Filled 2014-11-20: qty 2

## 2014-11-20 MED ORDER — BUPIVACAINE-EPINEPHRINE 0.25% -1:200000 IJ SOLN
INTRAMUSCULAR | Status: DC | PRN
  Administered 2014-11-20: 24 mL

## 2014-11-20 MED ORDER — LIDOCAINE HCL (CARDIAC) 20 MG/ML IV SOLN
INTRAVENOUS | Status: DC | PRN
  Administered 2014-11-20: 80 mg via INTRAVENOUS

## 2014-11-20 MED ORDER — PROPOFOL 10 MG/ML IV BOLUS
INTRAVENOUS | Status: AC
  Filled 2014-11-20: qty 20

## 2014-11-20 MED ORDER — EPHEDRINE SULFATE 50 MG/ML IJ SOLN
INTRAMUSCULAR | Status: DC | PRN
  Administered 2014-11-20: 5 mg via INTRAVENOUS

## 2014-11-20 MED ORDER — ACETAMINOPHEN 10 MG/ML IV SOLN
INTRAVENOUS | Status: AC
  Filled 2014-11-20: qty 100

## 2014-11-20 MED ORDER — ROCURONIUM BROMIDE 100 MG/10ML IV SOLN
INTRAVENOUS | Status: DC | PRN
  Administered 2014-11-20: 30 mg via INTRAVENOUS

## 2014-11-20 MED ORDER — MEPERIDINE HCL 25 MG/ML IJ SOLN: 6.2500 mg | INTRAMUSCULAR | Status: DC | PRN

## 2014-11-20 MED ORDER — ONDANSETRON HCL 4 MG/2ML IJ SOLN
INTRAMUSCULAR | Status: AC
  Filled 2014-11-20: qty 2

## 2014-11-20 MED ORDER — SUCCINYLCHOLINE CHLORIDE 20 MG/ML IJ SOLN
INTRAMUSCULAR | Status: DC | PRN
  Administered 2014-11-20: 100 mg via INTRAVENOUS

## 2014-11-20 MED ORDER — 0.9 % SODIUM CHLORIDE (POUR BTL) OPTIME
TOPICAL | Status: DC | PRN
  Administered 2014-11-20: 1000 mL

## 2014-11-20 MED ORDER — SODIUM CHLORIDE 0.9 % IJ SOLN
INTRAMUSCULAR | Status: AC
  Filled 2014-11-20: qty 10

## 2014-11-20 MED ORDER — BUPIVACAINE-EPINEPHRINE (PF) 0.25% -1:200000 IJ SOLN
INTRAMUSCULAR | Status: AC
  Filled 2014-11-20: qty 30

## 2014-11-20 SURGICAL SUPPLY — 28 items
APPLIER CLIP 5 13 M/L LIGAMAX5 (MISCELLANEOUS) ×3
CATH REDDICK CHOLANGI 4FR 50CM (CATHETERS) ×3 IMPLANT
CHLORAPREP W/TINT 26ML (MISCELLANEOUS) ×3 IMPLANT
CLIP APPLIE 5 13 M/L LIGAMAX5 (MISCELLANEOUS) ×1 IMPLANT
COVER MAYO STAND STRL (DRAPES) ×3 IMPLANT
COVER SURGICAL LIGHT HANDLE (MISCELLANEOUS) ×3 IMPLANT
DECANTER SPIKE VIAL GLASS SM (MISCELLANEOUS) ×3 IMPLANT
DRAPE C-ARM 42X120 X-RAY (DRAPES) ×3 IMPLANT
DRAPE LAPAROSCOPIC ABDOMINAL (DRAPES) ×3 IMPLANT
ELECT REM PT RETURN 9FT ADLT (ELECTROSURGICAL) ×3
ELECTRODE REM PT RTRN 9FT ADLT (ELECTROSURGICAL) ×1 IMPLANT
GLOVE BIO SURGEON STRL SZ7.5 (GLOVE) ×6 IMPLANT
GOWN STRL REUS W/ TWL XL LVL3 (GOWN DISPOSABLE) IMPLANT
GOWN STRL REUS W/TWL LRG LVL3 (GOWN DISPOSABLE) ×3 IMPLANT
GOWN STRL REUS W/TWL XL LVL3 (GOWN DISPOSABLE) ×3 IMPLANT
HEMOSTAT SURGICEL 4X8 (HEMOSTASIS) IMPLANT
IV CATH 14GX2 1/4 (CATHETERS) ×3 IMPLANT
KIT BASIN OR (CUSTOM PROCEDURE TRAY) ×3 IMPLANT
LIQUID BAND (GAUZE/BANDAGES/DRESSINGS) ×3 IMPLANT
POUCH SPECIMEN RETRIEVAL 10MM (ENDOMECHANICALS) ×3 IMPLANT
SET IRRIG TUBING LAPAROSCOPIC (IRRIGATION / IRRIGATOR) ×3 IMPLANT
SLEEVE XCEL OPT CAN 5 100 (ENDOMECHANICALS) ×6 IMPLANT
SUT MNCRL AB 4-0 PS2 18 (SUTURE) ×3 IMPLANT
TOWEL OR 17X26 10 PK STRL BLUE (TOWEL DISPOSABLE) ×3 IMPLANT
TOWEL OR NON WOVEN STRL DISP B (DISPOSABLE) ×3 IMPLANT
TRAY LAPAROSCOPIC (CUSTOM PROCEDURE TRAY) ×3 IMPLANT
TROCAR BLADELESS OPT 5 100 (ENDOMECHANICALS) ×3 IMPLANT
TROCAR XCEL BLUNT TIP 100MML (ENDOMECHANICALS) ×3 IMPLANT

## 2014-11-20 NOTE — Anesthesia Procedure Notes (Signed)
Procedure Name: Intubation Date/Time: 11/20/2014 7:21 AM Performed by: Jarvis NewcomerARMISTEAD, Eulanda Dorion A Pre-anesthesia Checklist: Patient identified, Timeout performed, Emergency Drugs available, Suction available and Patient being monitored Patient Re-evaluated:Patient Re-evaluated prior to inductionOxygen Delivery Method: Circle system utilized Preoxygenation: Pre-oxygenation with 100% oxygen Intubation Type: Rapid sequence, IV induction and Cricoid Pressure applied Laryngoscope Size: Mac and 4 Grade View: Grade I Tube type: Oral Tube size: 7.5 mm Number of attempts: 1 Airway Equipment and Method: Stylet Placement Confirmation: ETT inserted through vocal cords under direct vision,  breath sounds checked- equal and bilateral and positive ETCO2 Secured at: 21 cm Tube secured with: Tape Dental Injury: Teeth and Oropharynx as per pre-operative assessment

## 2014-11-20 NOTE — Progress Notes (Signed)
Dr Carolynne Edouardoth called and notified of temp 101 bp 97/50 500 ml NS bolus to be given  IS Genia DelEnc   Jasmina Gendron, Doran Durandynthia D

## 2014-11-20 NOTE — Anesthesia Postprocedure Evaluation (Signed)
  Anesthesia Post-op Note  Patient: Janice GlassingLaura Harrigan  Procedure(s) Performed: Procedure(s): LAPAROSCOPIC CHOLECYSTECTOMY WITH INTRAOPERATIVE CHOLANGIOGRAM (N/A)  Patient Location: PACU  Anesthesia Type:General  Level of Consciousness: awake, alert  and oriented  Airway and Oxygen Therapy: Patient Spontanous Breathing and Patient connected to nasal cannula oxygen  Post-op Pain: none  Post-op Assessment: Post-op Vital signs reviewed, Patient's Cardiovascular Status Stable, Respiratory Function Stable, Patent Airway and No signs of Nausea or vomiting              Post-op Vital Signs: Reviewed and stable  Last Vitals:  Filed Vitals:   11/20/14 0853  BP: 105/53  Pulse: 79  Temp:   Resp: 21    Complications: No apparent anesthesia complications

## 2014-11-20 NOTE — Transfer of Care (Signed)
Immediate Anesthesia Transfer of Care Note  Patient: Janice Edwards  Procedure(s) Performed: Procedure(s): LAPAROSCOPIC CHOLECYSTECTOMY WITH INTRAOPERATIVE CHOLANGIOGRAM (N/A)  Patient Location: PACU  Anesthesia Type:General  Level of Consciousness: awake, alert , oriented and patient cooperative  Airway & Oxygen Therapy: Patient Spontanous Breathing and Patient connected to face mask oxygen  Post-op Assessment: Report given to RN, Post -op Vital signs reviewed and stable and Patient moving all extremities  Post vital signs: Reviewed and stable  Last Vitals:  Filed Vitals:   11/20/14 0556  BP: 97/50  Pulse: 78  Temp: 38.3 C  Resp: 16    Complications: No apparent anesthesia complications

## 2014-11-20 NOTE — Anesthesia Preprocedure Evaluation (Addendum)
Anesthesia Evaluation  Patient identified by MRN, date of birth, ID band Patient awake    Reviewed: Allergy & Precautions, NPO status , Patient's Chart, lab work & pertinent test results, reviewed documented beta blocker date and time   Airway Mallampati: III   Neck ROM: Full    Dental  (+) Edentulous Upper, Edentulous Lower   Pulmonary Current Smoker,    breath sounds clear to auscultation       Cardiovascular negative cardio ROS   Rhythm:Regular     Neuro/Psych negative neurological ROS     GI/Hepatic Large stone in neck of gallbladder     Endo/Other  negative endocrine ROS  Renal/GU negative Renal ROS     Musculoskeletal   Abdominal (+)  Abdomen: soft.    Peds  Hematology WBC 19K   Anesthesia Other Findings   Reproductive/Obstetrics                           Anesthesia Physical Anesthesia Plan  ASA: II and emergent  Anesthesia Plan: General   Post-op Pain Management:    Induction: Intravenous and Rapid sequence  Airway Management Planned: Oral ETT  Additional Equipment:   Intra-op Plan:   Post-operative Plan: Extubation in OR  Informed Consent: I have reviewed the patients History and Physical, chart, labs and discussed the procedure including the risks, benefits and alternatives for the proposed anesthesia with the patient or authorized representative who has indicated his/her understanding and acceptance.     Plan Discussed with:   Anesthesia Plan Comments:         Anesthesia Quick Evaluation

## 2014-11-20 NOTE — Op Note (Signed)
11/19/2014 - 11/20/2014  8:24 AM  PATIENT:  Janice Edwards  49 y.o. female  PRE-OPERATIVE DIAGNOSIS:  Cholecystitis with cholelithiasis  POST-OPERATIVE DIAGNOSIS:  Cholecystitis with cholelithiasis  PROCEDURE:  Procedure(s): LAPAROSCOPIC CHOLECYSTECTOMY WITH INTRAOPERATIVE CHOLANGIOGRAM (N/A)  SURGEON:  Surgeon(s) and Role:    * Griselda Miner, MD - Primary    * Luretha Murphy, MD - Assisting  PHYSICIAN ASSISTANT:   ASSISTANTS: Dr. Daphine Deutscher   ANESTHESIA:   general  EBL:  Total I/O In: 1000 [I.V.:1000] Out: -   BLOOD ADMINISTERED:none  DRAINS: none   LOCAL MEDICATIONS USED:  MARCAINE     SPECIMEN:  Source of Specimen:  gallbladder  DISPOSITION OF SPECIMEN:  PATHOLOGY  COUNTS:  YES  TOURNIQUET:  * No tourniquets in log *  DICTATION: .Dragon Dictation   Procedure: After informed consent was obtained the patient was brought to the operating room and placed in the supine position on the operating room table. After adequate induction of general anesthesia the patient's abdomen was prepped with ChloraPrep allowed to dry and draped in usual sterile manner. The area below the umbilicus was infiltrated with quarter percent  Marcaine. A small incision was made with a 15 blade knife. The incision was carried down through the subcutaneous tissue bluntly with a hemostat and [employer] retractors. The linea alba was identified. The linea alba was incised with a 15 blade knife and each side was grasped with Coker clamps. The preperitoneal space was then probed with a hemostat until the peritoneum was opened and access was gained to the abdominal cavity. A 0 Vicryl pursestring stitch was placed in the fascia surrounding the opening. A Hassan cannula was then placed through the opening and anchored in place with the previously placed Vicryl purse string stitch. The abdomen was insufflated with carbon dioxide without difficulty. A laparoscope was inserted through the [facility] cannula in the right  upper quadrant was inspected. Next the epigastric region was infiltrated with % Marcaine. A small incision was made with a 15 blade knife. A 5 mm port was placed bluntly through this incision into the abdominal cavity under direct vision. Next 2 sites were chosen laterally on the right side of the abdomen for placement of 5 mm ports. Each of these areas was infiltrated with quarter percent Marcaine. Small stab incisions were made with a 15 blade knife. 5 mm ports were then placed bluntly through these incisions into the abdominal cavity under direct vision without difficulty. A blunt grasper was placed through the lateralmost 5 mm port and used to grasp the dome of the gallbladder and elevated anteriorly and superiorly. Another blunt grasper was placed through the other 5 mm port and used to retract the body and neck of the gallbladder. The gallbladder was very distended and inflamed. A dissector was placed through the epigastric port and using the electrocautery the peritoneal reflection at the gallbladder neck was opened. Blunt dissection was then carried out in this area until the gallbladder neck-cystic duct junction was readily identified and a good window was created. A single clip was placed on the gallbladder neck. A small  ductotomy was made just below the clip with laparoscopic scissors. A 14-gauge Angiocath was then placed through the anterior abdominal wall under direct vision. A Reddick cholangiogram catheter was then placed through the Angiocath and flushed. The catheter was then placed in the cystic duct and anchored in place with a clip. A cholangiogram was obtained that showed no filling defects good emptying into the duodenum an adequate  length on the cystic duct. The anchoring clip and catheters were then removed from the patient. 3 clips were placed proximally on the cystic duct and the duct was divided between the 2 sets of clips. Posterior to this the cystic artery was identified and again  dissected bluntly in a circumferential manner until a good window  was created. 2 clips were placed proximally and one distally on the artery and the artery was divided between the 2 sets of clips. Next a laparoscopic hook cautery device was used to separate the gallbladder from the liver bed. Prior to completely detaching the gallbladder from the liver bed the liver bed was inspected and several small bleeding points were coagulated with the electrocautery until the area was completely hemostatic. The gallbladder was then detached the rest of it from the liver bed without difficulty. A laparoscopic bag was inserted through the hassan port. The laparoscope was moved to the epigastric port. The gallbladder was placed within the bag and the bag was sealed.  The bag with the gallbladder was then [facility] the [facility] through the infraumbilical port without difficulty. The fascial defect was then closed with the previously placed Vicryl pursestring stitch as well as with another figure-of-eight 0 Vicryl stitch. The liver bed was inspected again and found to be hemostatic. The abdomen was irrigated with copious amounts of saline until the effluent was clear. The ports were then removed under direct vision without difficulty and were found to be hemostatic. The gas was allowed to escape. The skin incisions were all closed with interrupted 4-0 Monocryl subcuticular stitches. Dermabond dressings were applied. The patient tolerated the procedure well. At the end of the case all needle sponge and instrument counts were correct. The patient was then awakened and taken to recovery in stable condition  PLAN OF CARE: Admit to inpatient   PATIENT DISPOSITION:  PACU - hemodynamically stable.   Delay start of Pharmacological VTE agent (>24hrs) due to surgical blood loss or risk of bleeding: no

## 2014-11-21 ENCOUNTER — Encounter (HOSPITAL_COMMUNITY): Payer: Self-pay | Admitting: General Surgery

## 2014-11-21 MED ORDER — IBUPROFEN 200 MG PO TABS: 600.0000 mg | ORAL_TABLET | Freq: Four times a day (QID) | ORAL | Status: DC | PRN

## 2014-11-21 MED ORDER — OXYCODONE-ACETAMINOPHEN 5-325 MG PO TABS: 1.0000 | ORAL_TABLET | ORAL | Status: AC | PRN

## 2014-11-21 MED ORDER — OXYCODONE-ACETAMINOPHEN 5-325 MG PO TABS
1.0000 | ORAL_TABLET | ORAL | Status: DC | PRN
  Administered 2014-11-21: 1 via ORAL
  Filled 2014-11-21: qty 1

## 2014-11-21 MED ORDER — IBUPROFEN 200 MG PO TABS: ORAL_TABLET | ORAL | Status: AC

## 2014-11-21 NOTE — Discharge Instructions (Signed)
Laparoscopic Cholecystectomy, Care After °Refer to this sheet in the next few weeks. These instructions provide you with information about caring for yourself after your procedure. Your health care provider may also give you more specific instructions. Your treatment has been planned according to current medical practices, but problems sometimes occur. Call your health care provider if you have any problems or questions after your procedure. °WHAT TO EXPECT AFTER THE PROCEDURE °After your procedure, it is common to have: °· Pain at your incision sites. You will be given pain medicines to control your pain. °· Mild nausea or vomiting. This should improve after the first 24 hours. °· Bloating and possible shoulder pain from the gas that was used during the procedure. This will improve after the first 24 hours. °HOME CARE INSTRUCTIONS °Incision Care °· Follow instructions from your health care provider about how to take care of your incisions. Make sure you: °¨ Wash your hands with soap and water before you change your bandage (dressing). If soap and water are not available, use hand sanitizer. °¨ Change your dressing as told by your health care provider. °¨ Leave stitches (sutures), skin glue, or adhesive strips in place. These skin closures may need to be in place for 2 weeks or longer. If adhesive strip edges start to loosen and curl up, you may trim the loose edges. Do not remove adhesive strips completely unless your health care provider tells you to do that. °· Do not take baths, swim, or use a hot tub until your health care provider approves. Ask your health care provider if you can take showers. You may only be allowed to take sponge baths for bathing. °General Instructions °· Take over-the-counter and prescription medicines only as told by your health care provider. °· Do not drive or operate heavy machinery while taking prescription pain medicine. °· Return to your normal diet as told by your health care  provider. °· Do not lift anything that is heavier than 10 lb (4.5 kg). °· Do not play contact sports for one week or until your health care provider approves. °SEEK MEDICAL CARE IF:  °· You have redness, swelling, or pain at the site of your incision. °· You have fluid, blood, or pus coming from your incision. °· You notice a bad smell coming from your incision area. °· Your surgical incisions break open. °· You have a fever. °SEEK IMMEDIATE MEDICAL CARE IF: °· You develop a rash. °· You have difficulty breathing. °· You have chest pain. °· You have increasing pain in your shoulders (shoulder strap areas). °· You faint or have dizzy episodes while you are standing. °· You have severe pain in your abdomen. °· You have nausea or vomiting that lasts for more than one day. °  °This information is not intended to replace advice given to you by your health care provider. Make sure you discuss any questions you have with your health care provider. °  °Document Released: 01/14/2005 Document Revised: 10/05/2014 Document Reviewed: 08/26/2012 °Elsevier Interactive Patient Education ©2016 [employer] °CCS ______[facility] °LAPAROSCOPIC SURGERY: POST OP INSTRUCTIONS °Always review your discharge instruction sheet given to you by the facility where your surgery was performed. °IF YOU HAVE DISABILITY OR FAMILY LEAVE FORMS, YOU MUST BRING THEM TO THE OFFICE FOR PROCESSING.   °DO NOT GIVE THEM TO YOUR DOCTOR. ° °1. A prescription for pain medication may be given to you upon discharge.  Take your pain medication as prescribed, if needed.  If narcotic   pain medicine is not needed, then you may take acetaminophen (Tylenol) or ibuprofen (Advil) as needed. °2. Take your usually prescribed medications unless otherwise directed. °3. If you need a refill on your pain medication, please contact your pharmacy.  They will contact our office to request authorization. Prescriptions will not be filled after 5pm or on  week-ends. °4. You should follow a light diet the first few days after arrival home, such as soup and crackers, etc.  Be sure to include lots of fluids daily. °5. Most patients will experience some swelling and bruising in the area of the incisions.  Ice packs will help.  Swelling and bruising can take several days to resolve.  °6. It is common to experience some constipation if taking pain medication after surgery.  Increasing fluid intake and taking a stool softener (such as Colace) will usually help or prevent this problem from occurring.  A mild laxative (Milk of Magnesia or Miralax) should be taken according to package instructions if there are no bowel movements after 48 hours. °7. Unless discharge instructions indicate otherwise, you may remove your bandages 24-48 hours after surgery, and you may shower at that time.  You may have steri-strips (small skin tapes) in place directly over the incision.  These strips should be left on the skin for 7-10 days.  If your surgeon used skin glue on the incision, you may shower in 24 hours.  The glue will flake off over the next 2-3 weeks.  Any sutures or staples will be removed at the office during your follow-up visit. °8. ACTIVITIES:  You may resume regular (light) daily activities beginning the next day--such as daily self-care, walking, climbing stairs--gradually increasing activities as tolerated.  You may have sexual intercourse when it is comfortable.  Refrain from any heavy lifting or straining until approved by your doctor. °a. You may drive when you are no longer taking prescription pain medication, you can comfortably wear a seatbelt, and you can safely maneuver your car and apply brakes. °b. RETURN TO WORK:  __________________________________________________________ °9. You should see your doctor in the office for a follow-up appointment approximately 2-3 weeks after your surgery.  Make sure that you call for this appointment within a day or two after you  arrive home to insure a convenient appointment time. °10. OTHER INSTRUCTIONS: __________________________________________________________________________________________________________________________ __________________________________________________________________________________________________________________________ °WHEN TO CALL YOUR DOCTOR: °1. Fever over 101.0 °2. Inability to urinate °3. Continued bleeding from incision. °4. Increased pain, redness, or drainage from the incision. °5. Increasing abdominal pain ° °The clinic staff is available to answer your questions during regular business hours.  Please don’t hesitate to call and ask to speak to one of the nurses for clinical concerns.  If you have a medical emergency, go to the nearest emergency room or call 911.  A surgeon from [facility] is always on call at the hospital. °[address], [address], [place], [place]  [address] ? [address] [address], [place], [place]   [address] °[phone] ? [phone] ? FAX [phone] °Web site: [email or web address] ° °

## 2014-11-21 NOTE — Discharge Summary (Signed)
Physician Discharge Summary  Patient ID: Margaretha GlassingLaura Hon MRN: [id number] DOB/AGE: 47/06/1965 47 y.o.  Admit date: 11/19/2014 Discharge date: 11/21/2014  Admission Diagnoses:  Cholecystitis with cholelithiasis  Discharge Diagnoses:  Same  Active Problems:   Cholecystitis with cholelithiasis   PROCEDURES: LAPAROSCOPIC CHOLECYSTECTOMY WITH INTRAOPERATIVE CHOLANGIOGRAM, 11/20/14, Dr. Bufford Lopeoth  Hospital Course: The patient is a 49 year old white female who presents with a 1 day of abdominal pain. The pain is located in the right upper quadrant and epigastric region. The pain has been constant. The pain has been associated with significant nausea and vomiting. She went to the emergency department where a CT and ultrasound showed a 2 cm stone in the gallbladder neck with changes of cholecystitis. She does smoke.  She was seen and taken tot he OR by Dr. Carolynne Edouardoth day after admission.  She did well from the surgery and was doing well POD 1.  Her diet was advanced and she was discharged  In the afternoon after eating regular diet.  Follow up instructions given to her and her husband.    Condition on D/C:  Improved     Disposition: Final discharge disposition not confirmed     Medication List    ASK your doctor about these medications        cyclobenzaprine 10 MG tablet  Commonly known as:  FLEXERIL  Take 10 mg by mouth daily as needed for muscle spasms.           Follow-up Information    Follow up with [facility] On 11/30/2014.   Specialty:  General Surgery   Why:  Your appointment is at 12:00 noon, be at the office for check in at 11:30 AM.   Contact information:   [address] [place] [address] [phone]       Signed: Sherrie GeorgeJENNINGS,Karam Dunson 11/21/2014, 12:59 PM

## 2014-11-21 NOTE — Progress Notes (Signed)
1 Day Post-Op  Subjective: Up in chair, looks fine.  Tolerating clears.  Objective: Vital signs in last 24 hours: Temp:  [97.2 F (36.2 C)-99 F (37.2 C)] 97.2 F (36.2 C) (10/24 0515) Pulse Rate:  [54-87] 72 (10/24 0515) Resp:  [13-18] 16 (10/24 0515) BP: (88-110)/(46-64) 102/56 mmHg (10/24 0515) SpO2:  [96 %-100 %] 98 % (10/24 0515) Last BM Date: 11/17/14 720 PO  Afebrile,  VSS No labs IOC: normal Intake/Output from previous day: 10/23 0701 - 10/24 0700 In: 5438.3 [P.O.:720; I.V.:4718.3] Out: 1925 [Urine:1900; Blood:25] Intake/Output this shift:    General appearance: alert, cooperative and no distress Resp: clear to auscultation bilaterally GI: soft, port sites look fine.  Lab Results:   Recent Labs  11/19/14 1352  WBC 19.4*  HGB 13.7  HCT 40.8  PLT 371    BMET  Recent Labs  11/19/14 1352  NA 137  K 3.6  CL 103  CO2 26  GLUCOSE 107*  BUN 6  CREATININE 0.47  CALCIUM 9.3   PT/INR No results for input(s): LABPROT, INR in the last 72 hours.   Recent Labs Lab 11/19/14 1352  AST 23  ALT 19  ALKPHOS 66  BILITOT 0.6  PROT 8.2*  ALBUMIN 4.1     Lipase     Component Value Date/Time   LIPASE 18 11/19/2014 1352     Studies/Results: Dg Cholangiogram Operative  11/20/2014  CLINICAL DATA:  Acute calculus cholecystitis EXAM: INTRAOPERATIVE CHOLANGIOGRAM TECHNIQUE: Cholangiographic images from the C-arm fluoroscopic device were submitted for interpretation post-operatively. Please see the procedural report for the amount of contrast and the fluoroscopy time utilized. COMPARISON:  11/19/2014 FINDINGS: Intraoperative cholangiogram performed during laparoscopic cholecystectomy. The biliary confluence, common hepatic duct, and common bile duct are all patent. Contrast drains into the duodenum. No dilatation, obstruction or filling defect. IMPRESSION: Patent biliary system. Electronically Signed   By: Judie Petit.  Shick M.D.   On: 11/20/2014 09:32   US Abdomen  Complete  11/19/2014  CLINICAL DATA:  Epigastric pain radiating to right upper quadrant for 1 day with nausea and vomiting. EXAM: ULTRASOUND ABDOMEN COMPLETE COMPARISON:  CT from earlier same day. FINDINGS: Gallbladder: There is a large gallstone, measuring 2 cm greatest dimension, that appears to be fixed in the gallbladder neck region. Gallbladder is distended and there is pericholecystic edema and gallbladder wall thickening indicating acute cholecystitis. Sonographic Murphy's sign was elicited by the sonographer during the exam. Common bile duct: Diameter: Common bile duct not well seen. Common bile duct caliber is normal where seen at 4 mm. Liver: Liver is diffusely echogenic consistent with fatty infiltration. No focal mass or lesion identified within the liver. Main portal vein is shown to be patent with appropriate hepatopetal directional blood flow. IVC: No abnormality visualized. Pancreas: Limited visualization.  Pancreatic head appears normal. Spleen: Size and appearance within normal limits. Right Kidney: Length: 9.8 cm. Echogenicity within normal limits. No mass or hydronephrosis visualized. Left Kidney: Length: 10.2 cm. Echogenicity within normal limits. No mass or hydronephrosis visualized. Abdominal aorta: Limited visualization due to overlying bowel gas. Normal in caliber were seen. Other findings: None. IMPRESSION: 1. Findings consistent with acute cholecystitis. 2 cm gallstone fixed in the gallbladder neck region. Gallbladder is distended and there is gallbladder wall thickening and pericholecystic edema. 2. Fatty infiltration of the liver. 3. Limited visualization the pancreas and abdominal aorta. These results were called by telephone at the time of interpretation on 11/19/2014 at 6:27 pm to Dr. Gaylyn Rong , who verbally acknowledged  these results. Electronically Signed   By: Bary RichardStan  Maynard M.D.   On: 11/19/2014 18:27   Ct Angio Chest Aorta W/cm &/or Wo/cm  11/19/2014  CLINICAL DATA:   Sharp epigastric abdominal and back pain. EXAM: CT ANGIOGRAPHY CHEST, ABDOMEN AND PELVIS TECHNIQUE: Multidetector CT imaging through the chest, abdomen and pelvis was performed using the standard protocol during bolus administration of intravenous contrast. Multiplanar reconstructed images and MIPs were obtained and reviewed to evaluate the vascular anatomy. CONTRAST:  100mL OMNIPAQUE IOHEXOL 350 MG/ML SOLN COMPARISON:  None. FINDINGS: CTA CHEST FINDINGS No pneumothorax or pleural effusion is noted. No acute pulmonary disease is noted. There is no evidence of thoracic aortic dissection or aneurysm. Great vessels are widely patent. There is no definite evidence of pulmonary embolus. No mediastinal mass or adenopathy is noted. No significant osseous abnormality is noted. Review of the MIP images confirms the above findings. CTA ABDOMEN AND PELVIS FINDINGS Fatty infiltration of the liver is noted with sparing around the gallbladder fossa. Large solitary gallstone is noted in neck of gallbladder. The spleen and pancreas appear normal. Adrenal glands and kidneys appear normal. No hydronephrosis or renal obstruction is noted. There is no evidence of bowel obstruction. There is no evidence of abdominal aortic aneurysm or dissection. Mesenteric and renal arteries are widely patent. Visualized iliac arteries are patent. No abnormal fluid collection is noted. Urinary bladder appears normal. Uterus and ovaries appear normal. No significant adenopathy is noted. Review of the MIP images confirms the above findings. IMPRESSION: No evidence of thoracic or abdominal aortic aneurysm or dissection. Fatty infiltration of the liver is noted. Large solitary gallstone is noted in the neck of the gallbladder. Electronically Signed   By: Lupita RaiderJames  Green Jr, M.D.   On: 11/19/2014 17:02   Ct Angio Abd/pel W/ And/or W/o  11/19/2014  CLINICAL DATA:  Sharp epigastric abdominal and back pain. EXAM: CT ANGIOGRAPHY CHEST, ABDOMEN AND PELVIS  TECHNIQUE: Multidetector CT imaging through the chest, abdomen and pelvis was performed using the standard protocol during bolus administration of intravenous contrast. Multiplanar reconstructed images and MIPs were obtained and reviewed to evaluate the vascular anatomy. CONTRAST:  100mL OMNIPAQUE IOHEXOL 350 MG/ML SOLN COMPARISON:  None. FINDINGS: CTA CHEST FINDINGS No pneumothorax or pleural effusion is noted. No acute pulmonary disease is noted. There is no evidence of thoracic aortic dissection or aneurysm. Great vessels are widely patent. There is no definite evidence of pulmonary embolus. No mediastinal mass or adenopathy is noted. No significant osseous abnormality is noted. Review of the MIP images confirms the above findings. CTA ABDOMEN AND PELVIS FINDINGS Fatty infiltration of the liver is noted with sparing around the gallbladder fossa. Large solitary gallstone is noted in neck of gallbladder. The spleen and pancreas appear normal. Adrenal glands and kidneys appear normal. No hydronephrosis or renal obstruction is noted. There is no evidence of bowel obstruction. There is no evidence of abdominal aortic aneurysm or dissection. Mesenteric and renal arteries are widely patent. Visualized iliac arteries are patent. No abnormal fluid collection is noted. Urinary bladder appears normal. Uterus and ovaries appear normal. No significant adenopathy is noted. Review of the MIP images confirms the above findings. IMPRESSION: No evidence of thoracic or abdominal aortic aneurysm or dissection. Fatty infiltration of the liver is noted. Large solitary gallstone is noted in the neck of the gallbladder. Electronically Signed   By: Lupita RaiderJames  Green Jr, M.D.   On: 11/19/2014 17:02    Medications: . pantoprazole (PROTONIX) IV  40 mg Intravenous QHS  .  piperacillin-tazobactam (ZOSYN)  IV  3.375 g Intravenous Q8H   . dextrose 5 % and 0.9 % NaCl with KCl 20 mEq/L 100 mL/hr (11/20/14 2120)   Prior to Admission medications    Medication Sig Start Date End Date Taking? Authorizing Provider  cyclobenzaprine (FLEXERIL) 10 MG tablet Take 10 mg by mouth daily as needed for muscle spasms.   Yes Historical Provider, MD     Assessment/Plan Cholecystitis with cholelithiasis LAPAROSCOPIC CHOLECYSTECTOMY WITH INTRAOPERATIVE CHOLANGIOGRAM, 11/20/14, Dr. Carolynne Edouard Antibiotics:  Zosyn day 2,  DVT:  SCD  Plan:  Soft diet, ambulate, stop Zosyn, and home later today if she is doing well.         Jamine Highfill 11/21/2014

## 2016-10-08 ENCOUNTER — Other Ambulatory Visit: Payer: Self-pay

## 2016-10-08 DIAGNOSIS — M7989 Other specified soft tissue disorders: Secondary | ICD-10-CM

## 2016-10-14 ENCOUNTER — Encounter (HOSPITAL_COMMUNITY): Payer: Self-pay

## 2016-10-16 ENCOUNTER — Ambulatory Visit (HOSPITAL_COMMUNITY)
Admission: RE | Admit: 2016-10-16 | Discharge: 2016-10-16 | Disposition: A | Discharge status: Home / Self Care | Payer: Medicaid Other | Source: Ambulatory Visit | Attending: Vascular Surgery | Admitting: Vascular Surgery

## 2016-10-16 DIAGNOSIS — M7989 Other specified soft tissue disorders: Secondary | ICD-10-CM | POA: Insufficient documentation

## 2016-10-17 ENCOUNTER — Ambulatory Visit (HOSPITAL_COMMUNITY)
Admission: RE | Admit: 2016-10-17 | Discharge: 2016-10-17 | Disposition: A | Discharge status: Home / Self Care | Payer: Medicaid Other | Source: Ambulatory Visit | Attending: Surgery | Admitting: Surgery

## 2016-10-17 DIAGNOSIS — M7989 Other specified soft tissue disorders: Secondary | ICD-10-CM | POA: Insufficient documentation

## 2016-12-05 IMAGING — CT CT CTA ABD/PEL W/CM AND/OR W/O CM
2 of 6 series · 15 of 46 positions shown, 18 images · IV contrast (APPLIED)
Comparison: None.

CLINICAL DATA: Sharp epigastric abdominal and back pain.

EXAM:
CT ANGIOGRAPHY CHEST, ABDOMEN AND PELVIS
TECHNIQUE: Multidetector CT imaging through the chest, abdomen and pelvis was
performed using the standard protocol during bolus administration of
intravenous contrast. Multiplanar reconstructed images and MIPs were
obtained and reviewed to evaluate the vascular anatomy.
CONTRAST:  100mL OMNIPAQUE IOHEXOL 350 MG/ML SOLN

[Series 4: dissection 2.0 b26f · axial · 0.77mm/px · z∈[-521,+5]mm · 12 of 298 slices shown, 15 images]
[im 23/298  soft-tissue]
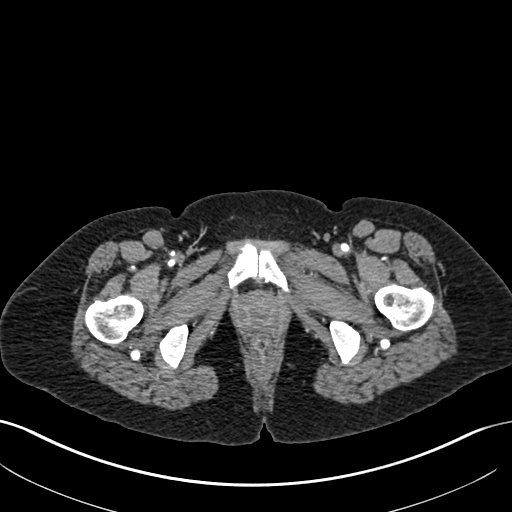
[im 23/298  bone]
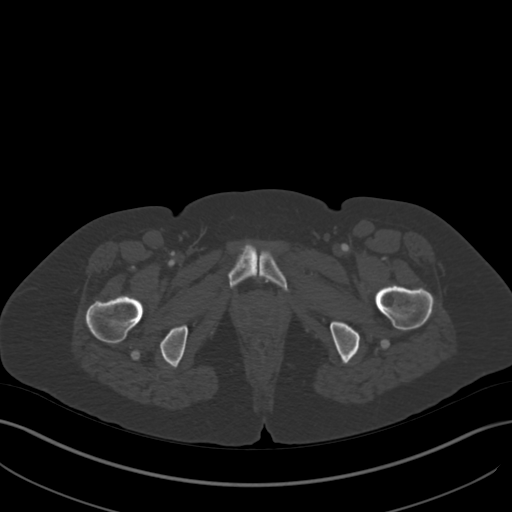
[im 58/298  soft-tissue]
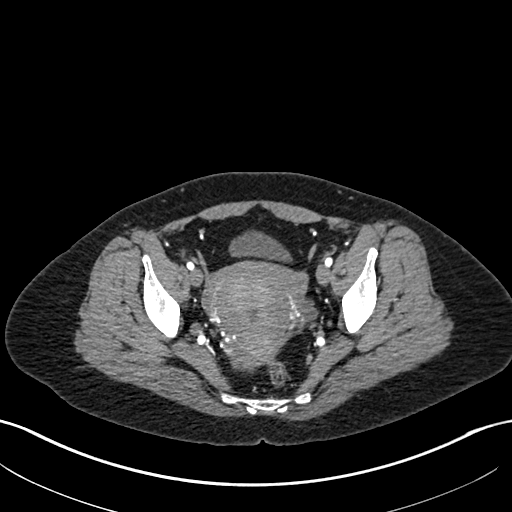
[im 80/298  soft-tissue]
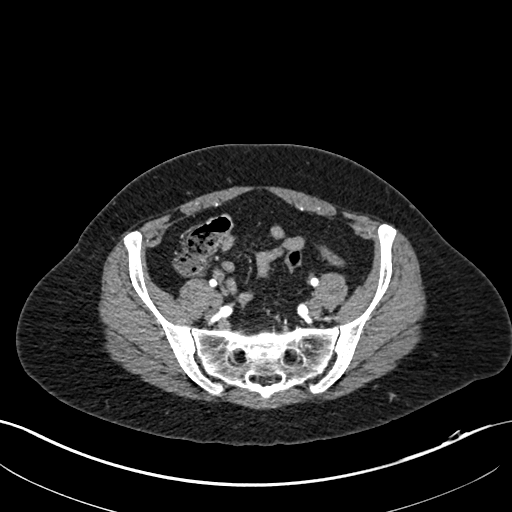
[im 115/298  soft-tissue]
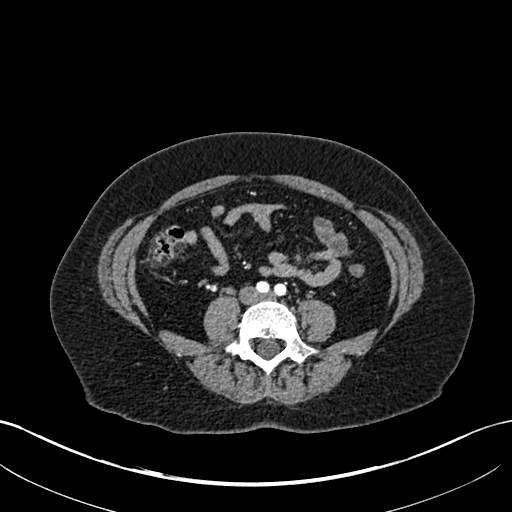
[im 149/298  soft-tissue]
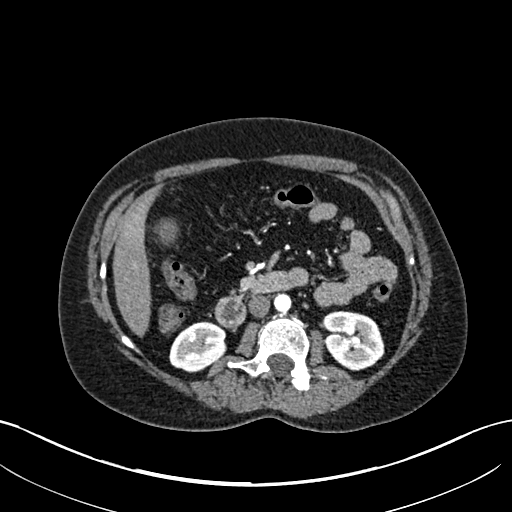
[im 183/298  soft-tissue]
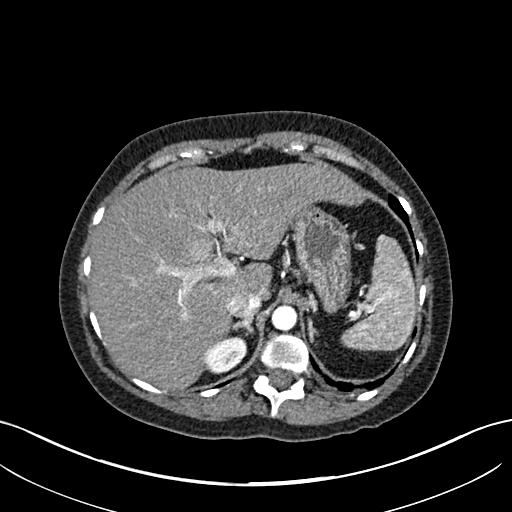
[im 218/298  soft-tissue]
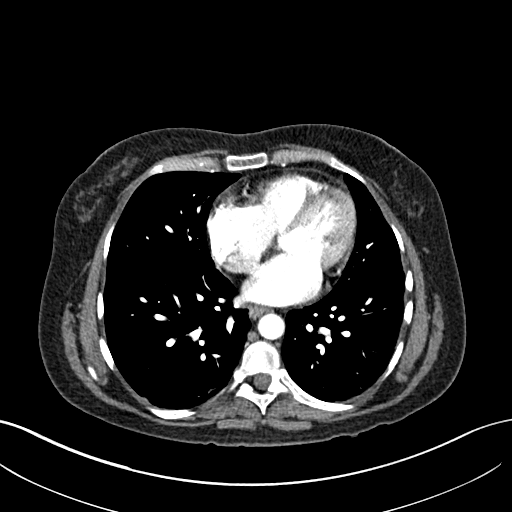
[im 240/298  soft-tissue]
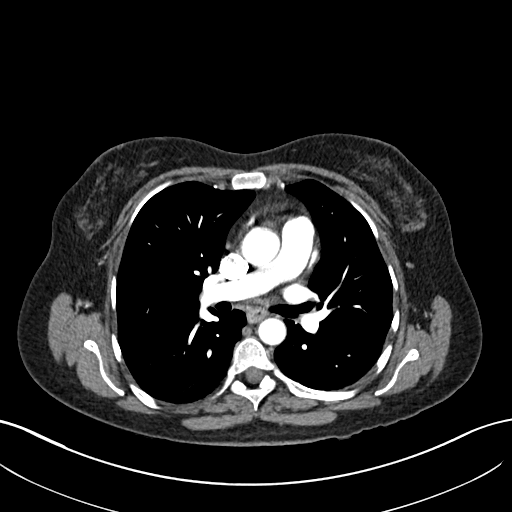
[im 252/298  lung]
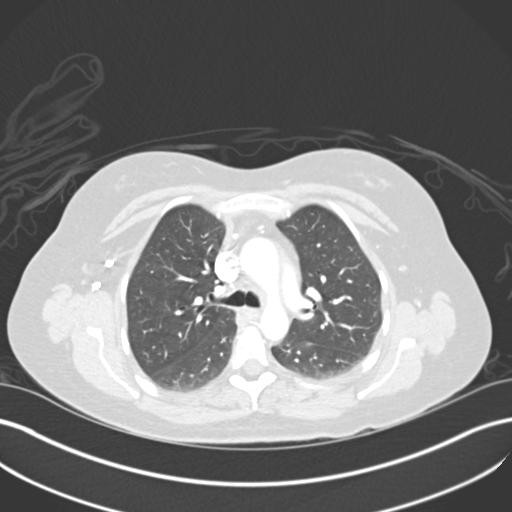
[im 263/298  lung]
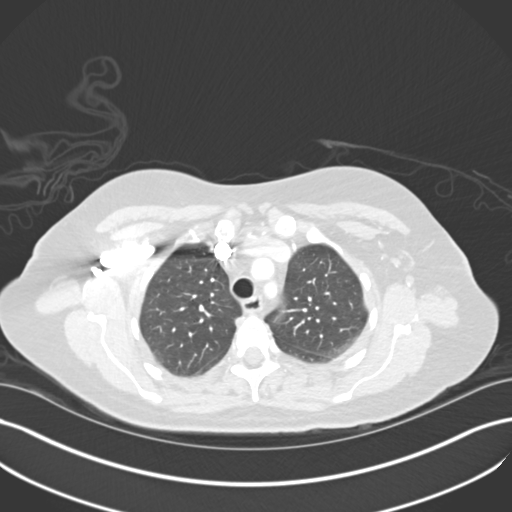
[im 275/298  soft-tissue]
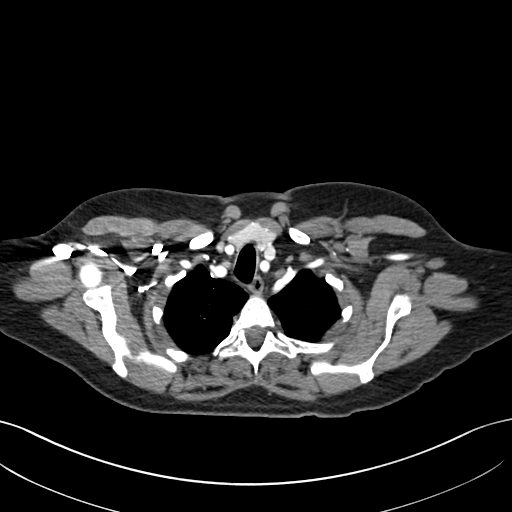
[im 275/298  lung]
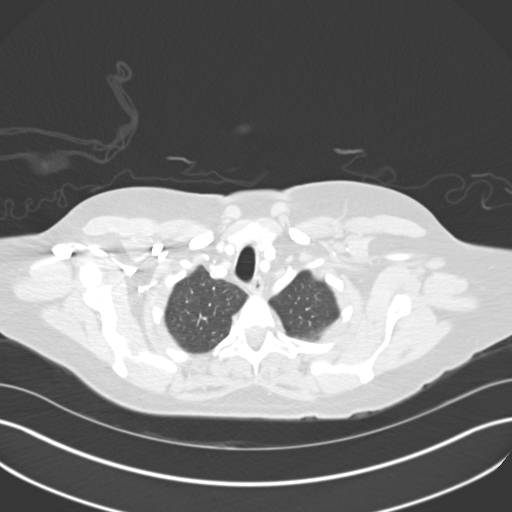
[im 275/298  bone]
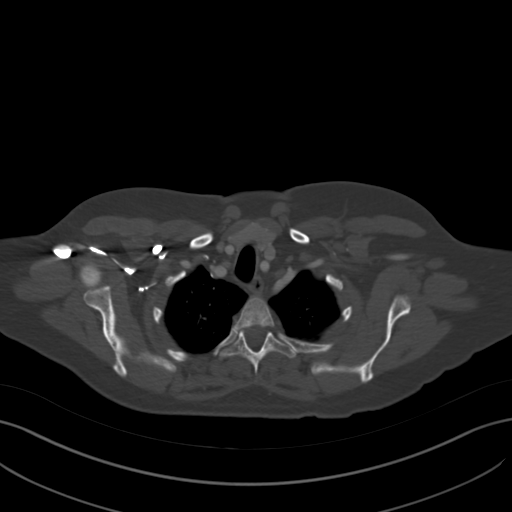
[im 286/298  lung]
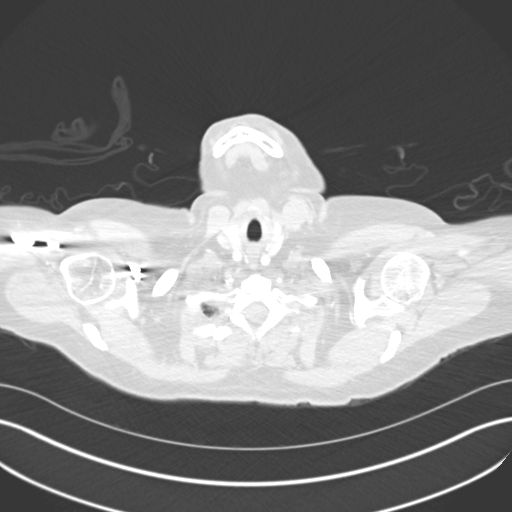

[Series 7: dissection 2.0 coronal · coronal · 1.01mm/px · 3 of 123 slices shown]
[im 31/123  soft-tissue]
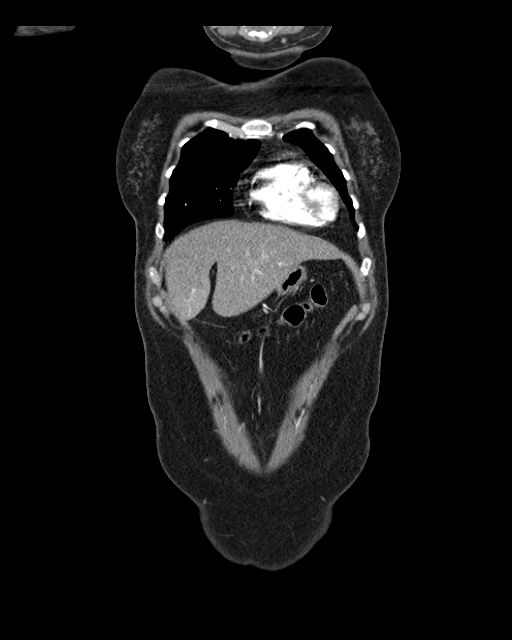
[im 62/123  soft-tissue]
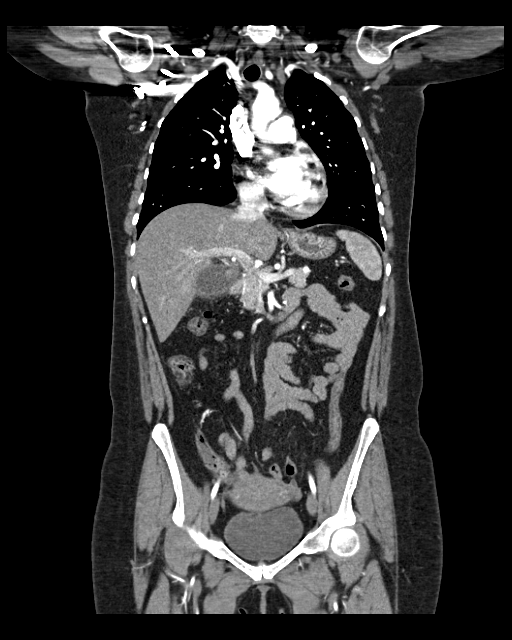
[im 92/123  soft-tissue]
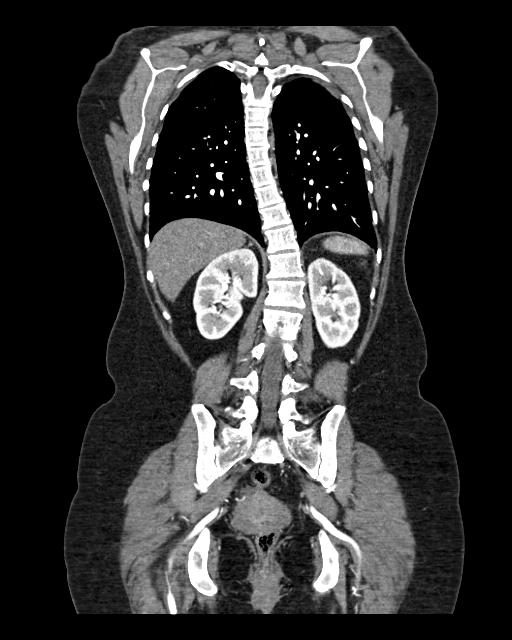

[15 of 46 positions shown; findings below may reference images not displayed]

FINDINGS: CTA CHEST FINDINGS

No pneumothorax or pleural effusion is noted. No acute pulmonary
disease is noted. There is no evidence of thoracic aortic dissection
or aneurysm. Great vessels are widely patent. There is no definite
evidence of pulmonary embolus. No mediastinal mass or adenopathy is
noted. No significant osseous abnormality is noted.

Review of the MIP images confirms the above findings.

CTA ABDOMEN AND PELVIS FINDINGS

Fatty infiltration of the liver is noted with sparing around the
gallbladder fossa. Large solitary gallstone is noted in neck of
gallbladder. The spleen and pancreas appear normal. Adrenal glands
and kidneys appear normal. No hydronephrosis or renal obstruction is
noted. There is no evidence of bowel obstruction. There is no
evidence of abdominal aortic aneurysm or dissection. Mesenteric and
renal arteries are widely patent. Visualized iliac arteries are
patent. No abnormal fluid collection is noted. Urinary bladder
appears normal. Uterus and ovaries appear normal. No significant
adenopathy is noted.

Review of the MIP images confirms the above findings.
IMPRESSION: No evidence of thoracic or abdominal aortic aneurysm or dissection.

Fatty infiltration of the liver is noted.

Large solitary gallstone is noted in the neck of the gallbladder.

## 2016-12-10 ENCOUNTER — Other Ambulatory Visit: Payer: Self-pay | Admitting: Family Medicine

## 2016-12-10 DIAGNOSIS — Z1231 Encounter for screening mammogram for malignant neoplasm of breast: Secondary | ICD-10-CM

## 2017-07-09 DIAGNOSIS — E782 Mixed hyperlipidemia: Secondary | ICD-10-CM | POA: Diagnosis not present

## 2017-07-09 DIAGNOSIS — Z720 Tobacco use: Secondary | ICD-10-CM | POA: Diagnosis not present

## 2017-07-09 DIAGNOSIS — M5410 Radiculopathy, site unspecified: Secondary | ICD-10-CM | POA: Diagnosis not present

## 2017-07-09 DIAGNOSIS — R2243 Localized swelling, mass and lump, lower limb, bilateral: Secondary | ICD-10-CM | POA: Diagnosis not present

## 2017-07-09 DIAGNOSIS — F419 Anxiety disorder, unspecified: Secondary | ICD-10-CM | POA: Diagnosis not present

## 2017-07-09 DIAGNOSIS — G629 Polyneuropathy, unspecified: Secondary | ICD-10-CM | POA: Diagnosis not present

## 2017-09-06 ENCOUNTER — Other Ambulatory Visit: Payer: Self-pay

## 2017-09-06 ENCOUNTER — Encounter (HOSPITAL_BASED_OUTPATIENT_CLINIC_OR_DEPARTMENT_OTHER): Payer: Self-pay | Admitting: *Deleted

## 2017-09-06 ENCOUNTER — Emergency Department (HOSPITAL_BASED_OUTPATIENT_CLINIC_OR_DEPARTMENT_OTHER)
Admission: EM | Admit: 2017-09-06 | Discharge: 2017-09-07 | Disposition: A | Discharge status: Home / Self Care | Payer: Medicare Other | Attending: Emergency Medicine | Admitting: Emergency Medicine

## 2017-09-06 ENCOUNTER — Emergency Department (HOSPITAL_BASED_OUTPATIENT_CLINIC_OR_DEPARTMENT_OTHER): Payer: Medicare Other

## 2017-09-06 DIAGNOSIS — R519 Headache, unspecified: Secondary | ICD-10-CM

## 2017-09-06 DIAGNOSIS — F17210 Nicotine dependence, cigarettes, uncomplicated: Secondary | ICD-10-CM | POA: Insufficient documentation

## 2017-09-06 DIAGNOSIS — R109 Unspecified abdominal pain: Secondary | ICD-10-CM

## 2017-09-06 DIAGNOSIS — N3000 Acute cystitis without hematuria: Secondary | ICD-10-CM

## 2017-09-06 DIAGNOSIS — R1013 Epigastric pain: Secondary | ICD-10-CM | POA: Diagnosis not present

## 2017-09-06 DIAGNOSIS — R51 Headache: Secondary | ICD-10-CM | POA: Diagnosis not present

## 2017-09-06 DIAGNOSIS — R112 Nausea with vomiting, unspecified: Secondary | ICD-10-CM | POA: Diagnosis not present

## 2017-09-06 DIAGNOSIS — R1084 Generalized abdominal pain: Secondary | ICD-10-CM | POA: Diagnosis not present

## 2017-09-06 LAB — CBC WITH DIFFERENTIAL/PLATELET
BASOS ABS: 0 10*3/uL (ref 0.0–0.1)
BASOS PCT: 0 %
Eosinophils Absolute: 0.2 10*3/uL (ref 0.0–0.7)
Eosinophils Relative: 2 %
HCT: 40.1 % (ref 36.0–46.0)
HEMOGLOBIN: 13.6 g/dL (ref 12.0–15.0)
Lymphocytes Relative: 20 %
Lymphs Abs: 2 10*3/uL (ref 0.7–4.0)
MCH: 31.3 pg (ref 26.0–34.0)
MCHC: 33.9 g/dL (ref 30.0–36.0)
MCV: 92.2 fL (ref 78.0–100.0)
MONO ABS: 0.7 10*3/uL (ref 0.1–1.0)
Monocytes Relative: 7 %
NEUTROS ABS: 7 10*3/uL (ref 1.7–7.7)
NEUTROS PCT: 71 %
Platelets: 286 10*3/uL (ref 150–400)
RBC: 4.35 MIL/uL (ref 3.87–5.11)
RDW: 13.9 % (ref 11.5–15.5)
WBC: 9.9 10*3/uL (ref 4.0–10.5)

## 2017-09-06 LAB — COMPREHENSIVE METABOLIC PANEL
ALBUMIN: 4.2 g/dL (ref 3.5–5.0)
ALT: 32 U/L (ref 0–44)
AST: 28 U/L (ref 15–41)
Alkaline Phosphatase: 60 U/L (ref 38–126)
Anion gap: 9 (ref 5–15)
BUN: 12 mg/dL (ref 6–20)
CO2: 26 mmol/L (ref 22–32)
CREATININE: 0.71 mg/dL (ref 0.44–1.00)
Calcium: 9.2 mg/dL (ref 8.9–10.3)
Chloride: 101 mmol/L (ref 98–111)
GFR calc Af Amer: >60 mL/min (ref >60)
GFR calc non Af Amer: >60 mL/min (ref >60)
GLUCOSE: 90 mg/dL (ref 70–99)
POTASSIUM: 3.3 mmol/L — AB (ref 3.5–5.1)
Sodium: 136 mmol/L (ref 135–145)
TOTAL PROTEIN: 8 g/dL (ref 6.5–8.1)
Total Bilirubin: 0.9 mg/dL (ref 0.3–1.2)

## 2017-09-06 LAB — URINALYSIS, MICROSCOPIC (REFLEX)

## 2017-09-06 LAB — URINALYSIS, ROUTINE W REFLEX MICROSCOPIC
GLUCOSE, UA: NEGATIVE mg/dL
Ketones, ur: 15 mg/dL — AB
Nitrite: NEGATIVE
PH: 5.5 (ref 5.0–8.0)
Protein, ur: NEGATIVE mg/dL
Specific Gravity, Urine: 1.025 (ref 1.005–1.030)

## 2017-09-06 LAB — LIPASE, BLOOD: Lipase: 23 U/L (ref 11–51)

## 2017-09-06 MED ORDER — MORPHINE SULFATE (PF) 4 MG/ML IV SOLN
4.0000 mg | Freq: Once | INTRAVENOUS | Status: DC
  Filled 2017-09-06: qty 1

## 2017-09-06 MED ORDER — IOPAMIDOL (ISOVUE-300) INJECTION 61%
100.0000 mL | Freq: Once | INTRAVENOUS | Status: AC | PRN
  Administered 2017-09-06: 100 mL via INTRAVENOUS

## 2017-09-06 MED ORDER — DIPHENHYDRAMINE HCL 50 MG/ML IJ SOLN
12.5000 mg | Freq: Once | INTRAMUSCULAR | Status: AC
  Administered 2017-09-06: 12.5 mg via INTRAVENOUS
  Filled 2017-09-06: qty 1

## 2017-09-06 MED ORDER — METOCLOPRAMIDE HCL 5 MG/ML IJ SOLN
10.0000 mg | Freq: Once | INTRAMUSCULAR | Status: AC
  Administered 2017-09-06: 10 mg via INTRAVENOUS
  Filled 2017-09-06: qty 2

## 2017-09-06 MED ORDER — ONDANSETRON 4 MG PO TBDP: 4.0000 mg | ORAL_TABLET | Freq: Three times a day (TID) | ORAL | 0 refills | Status: AC | PRN

## 2017-09-06 MED ORDER — SULFAMETHOXAZOLE-TRIMETHOPRIM 800-160 MG PO TABS: 1.0000 | ORAL_TABLET | Freq: Two times a day (BID) | ORAL | 0 refills | Status: AC

## 2017-09-06 MED ORDER — SODIUM CHLORIDE 0.9 % IV SOLN
INTRAVENOUS | Status: DC
  Administered 2017-09-07: 125 mL/h via INTRAVENOUS

## 2017-09-06 MED ORDER — SODIUM CHLORIDE 0.9 % IV SOLN
1.0000 g | Freq: Once | INTRAVENOUS | Status: AC
  Administered 2017-09-07: 1 g via INTRAVENOUS
  Filled 2017-09-06: qty 10

## 2017-09-06 MED ORDER — SODIUM CHLORIDE 0.9 % IV BOLUS
1000.0000 mL | Freq: Once | INTRAVENOUS | Status: AC
  Administered 2017-09-06: 1000 mL via INTRAVENOUS

## 2017-09-06 MED ORDER — MORPHINE SULFATE (PF) 4 MG/ML IV SOLN
4.0000 mg | Freq: Once | INTRAVENOUS | Status: AC
  Administered 2017-09-06: 4 mg via INTRAVENOUS
  Filled 2017-09-06: qty 1

## 2017-09-06 NOTE — ED Provider Notes (Signed)
[facility] EMERGENCY DEPARTMENT Provider Note   CSN: [id number] Arrival date & time: 09/06/17  1943     History   Chief Complaint Chief Complaint  Patient presents with  . Headache    HPI Janice Edwards is a 52 y.o. female.  Pt presents to the ED today with headache, n/v, and abdominal pain for 4 days.  Pt denies f/c.  No constipation/diarrhea.  No known sick contacts.     Past Medical History:  Diagnosis Date  . Back pain     Patient Active Problem List   Diagnosis Date Noted  . Cholecystitis with cholelithiasis 11/19/2014    Past Surgical History:  Procedure Laterality Date  . CHOLECYSTECTOMY N/A 11/20/2014   Procedure: LAPAROSCOPIC CHOLECYSTECTOMY WITH INTRAOPERATIVE CHOLANGIOGRAM;  Surgeon: Chevis PrettyPaul Toth III, MD;  Location: [facility];  Service: General;  Laterality: N/A;     OB History   None      Home Medications    Prior to Admission medications   Medication Sig Start Date End Date Taking? Authorizing Provider  cyclobenzaprine (FLEXERIL) 10 MG tablet Take 10 mg by mouth daily as needed for muscle spasms.    [provider]  ibuprofen (ADVIL,MOTRIN) 200 MG tablet You can safely take 2-3 tablets every 6 hours as needed for pain. 11/21/14   Sherrie GeorgeJennings, Willard, PA-C  ondansetron (ZOFRAN ODT) 4 MG disintegrating tablet Take 1 tablet (4 mg total) by mouth every 8 (eight) hours as needed. 09/06/17   Jacalyn LefevreHaviland, Randy Castrejon, MD  oxyCODONE-acetaminophen (PERCOCET/ROXICET) 5-325 MG tablet Take 1-2 tablets by mouth every 4 (four) hours as needed for moderate pain. 11/21/14   Sherrie GeorgeJennings, Willard, PA-C  sulfamethoxazole-trimethoprim (BACTRIM DS,SEPTRA DS) 800-160 MG tablet Take 1 tablet by mouth 2 (two) times daily for 7 days. 09/06/17 09/13/17  Jacalyn LefevreHaviland, Jeptha Hinnenkamp, MD    Family History History reviewed. No pertinent family history.  Social History Social History   Tobacco Use  . Smoking status: Current Every Day Smoker    Packs/day: 1.00    Types: Cigarettes    Substance Use Topics  . Alcohol use: No  . Drug use: No     Allergies   Patient has no known allergies.   Review of Systems Review of Systems  Gastrointestinal: Positive for abdominal pain, nausea and vomiting.  Neurological: Positive for headaches.  All other systems reviewed and are negative.    Physical Exam Updated Vital Signs BP 117/74 (BP Location: Right Arm)   Pulse 64   Temp 98 F (36.7 C) (Oral)   Resp 16   Ht 5\' 6"  (1.676 m)   Wt 59 kg   SpO2 98%   BMI 20.98 kg/m   Physical Exam  Constitutional: She is oriented to person, place, and time. She appears well-developed and well-nourished.  HENT:  Head: Normocephalic and atraumatic.  Mouth/Throat: Mucous membranes are dry.  Eyes: Pupils are equal, round, and reactive to light. EOM are normal.  Neck: Normal range of motion. Neck supple.  Cardiovascular: Normal rate, regular rhythm, normal heart sounds and intact distal pulses.  Pulmonary/Chest: Effort normal and breath sounds normal.  Abdominal: Soft. She exhibits distension. There is generalized tenderness.  Musculoskeletal: Normal range of motion.  Neurological: She is alert and oriented to person, place, and time. She has normal strength. She displays a negative Romberg sign.  Skin: Skin is warm and dry. Capillary refill takes less than 2 seconds.  Psychiatric: She has a normal mood and affect. Her behavior is normal.  Nursing note and vitals  reviewed.    ED Treatments / Results  Labs (all labs ordered are listed, but only abnormal results are displayed) Labs Reviewed  COMPREHENSIVE METABOLIC PANEL - Abnormal; Notable for the following components:      Result Value   Potassium 3.3 (*)    All other components within normal limits  URINALYSIS, ROUTINE W REFLEX MICROSCOPIC - Abnormal; Notable for the following components:   APPearance CLOUDY (*)    Hgb urine dipstick TRACE (*)    Bilirubin Urine SMALL (*)    Ketones, ur 15 (*)    Leukocytes, UA  SMALL (*)    All other components within normal limits  URINALYSIS, MICROSCOPIC (REFLEX) - Abnormal; Notable for the following components:   Bacteria, UA MANY (*)    All other components within normal limits  CBC WITH DIFFERENTIAL/PLATELET  LIPASE, BLOOD    EKG None  Radiology No results found.  Procedures Procedures (including critical care time)  Medications Ordered in ED Medications  sodium chloride 0.9 % bolus 1,000 mL (1,000 mLs Intravenous New Bag/Given 09/06/17 2253)    And  0.9 %  sodium chloride infusion (has no administration in time range)  cefTRIAXone (ROCEPHIN) 1 g in sodium chloride 0.9 % 100 mL IVPB (has no administration in time range)  morphine 4 MG/ML injection 4 mg (has no administration in time range)  metoCLOPramide (REGLAN) injection 10 mg (10 mg Intravenous Given 09/06/17 2259)  diphenhydrAMINE (BENADRYL) injection 12.5 mg (12.5 mg Intravenous Given 09/06/17 2300)  morphine 4 MG/ML injection 4 mg (4 mg Intravenous Given 09/06/17 2258)     Initial Impression / Assessment and Plan / ED Course  I have reviewed the triage vital signs and the nursing notes.  Pertinent labs & imaging results that were available during my care of the patient were reviewed by me and considered in my medical decision making (see chart for details).   Pt is still c/o headache and abdominal pain.  Nausea has improved.  I added on a ct head and abd/pelvis for further eval.  Pt signed out to Dr. Read Drivers.    Final Clinical Impressions(s) / ED Diagnoses   Final diagnoses:  Acute nonintractable headache, unspecified headache type  Acute cystitis without hematuria  Abdominal pain, unspecified abdominal location    ED Discharge Orders         Ordered    sulfamethoxazole-trimethoprim (BACTRIM DS,SEPTRA DS) 800-160 MG tablet  2 times daily     09/06/17 2324    ondansetron (ZOFRAN ODT) 4 MG disintegrating tablet  Every 8 hours PRN     09/06/17 2324           Jacalyn Lefevre,  MD 09/06/17 2325

## 2017-09-06 NOTE — ED Triage Notes (Signed)
Pt reports vomiting x 4 days, HA x 3.  No acute distress noted in triage. A/O.  Denies fever, diarrhea.

## 2017-09-07 DIAGNOSIS — R112 Nausea with vomiting, unspecified: Secondary | ICD-10-CM | POA: Diagnosis not present

## 2017-09-07 DIAGNOSIS — R1013 Epigastric pain: Secondary | ICD-10-CM | POA: Diagnosis not present

## 2017-09-07 DIAGNOSIS — R51 Headache: Secondary | ICD-10-CM | POA: Diagnosis not present

## 2017-09-07 NOTE — ED Provider Notes (Signed)
Nursing notes and vitals signs, including pulse oximetry, reviewed.  Summary of this visit's results, reviewed by myself:  EKG:  EKG Interpretation  Date/Time:    Ventricular Rate:    PR Interval:    QRS Duration:   QT Interval:    QTC Calculation:   R Axis:     Text Interpretation:         Labs:  Results for orders placed or performed during the hospital encounter of 09/06/17 (from the past 24 hour(s))  CBC with Differential/Platelet     Status: None   Collection Time: 09/06/17 10:49 PM  Result Value Ref Range   WBC 9.9 4.0 - 10.5 K/uL   RBC 4.35 3.87 - 5.11 MIL/uL   Hemoglobin 13.6 12.0 - 15.0 g/dL   HCT 13.2 44.0 - 10.2 %   MCV 92.2 78.0 - 100.0 fL   MCH 31.3 26.0 - 34.0 pg   MCHC 33.9 30.0 - 36.0 g/dL   RDW 72.5 36.6 - 44.0 %   Platelets 286 150 - 400 K/uL   Neutrophils Relative % 71 %   Neutro Abs 7.0 1.7 - 7.7 K/uL   Lymphocytes Relative 20 %   Lymphs Abs 2.0 0.7 - 4.0 K/uL   Monocytes Relative 7 %   Monocytes Absolute 0.7 0.1 - 1.0 K/uL   Eosinophils Relative 2 %   Eosinophils Absolute 0.2 0.0 - 0.7 K/uL   Basophils Relative 0 %   Basophils Absolute 0.0 0.0 - 0.1 K/uL  Comprehensive metabolic panel     Status: Abnormal   Collection Time: 09/06/17 10:49 PM  Result Value Ref Range   Sodium 136 135 - 145 mmol/L   Potassium 3.3 (L) 3.5 - 5.1 mmol/L   Chloride 101 98 - 111 mmol/L   CO2 26 22 - 32 mmol/L   Glucose, Bld 90 70 - 99 mg/dL   BUN 12 6 - 20 mg/dL   Creatinine, Ser 3.47 0.44 - 1.00 mg/dL   Calcium 9.2 8.9 - 42.5 mg/dL   Total Protein 8.0 6.5 - 8.1 g/dL   Albumin 4.2 3.5 - 5.0 g/dL   AST 28 15 - 41 U/L   ALT 32 0 - 44 U/L   Alkaline Phosphatase 60 38 - 126 U/L   Total Bilirubin 0.9 0.3 - 1.2 mg/dL   GFR calc non Af Amer >60 >60 mL/min   GFR calc Af Amer >60 >60 mL/min   Anion gap 9 5 - 15  Lipase, blood     Status: None   Collection Time: 09/06/17 10:49 PM  Result Value Ref Range   Lipase 23 11 - 51 U/L  Urinalysis, Routine w reflex  microscopic     Status: Abnormal   Collection Time: 09/06/17 10:49 PM  Result Value Ref Range   Color, Urine YELLOW YELLOW   APPearance CLOUDY (A) CLEAR   Specific Gravity, Urine 1.025 1.005 - 1.030   pH 5.5 5.0 - 8.0   Glucose, UA NEGATIVE NEGATIVE mg/dL   Hgb urine dipstick TRACE (A) NEGATIVE   Bilirubin Urine SMALL (A) NEGATIVE   Ketones, ur 15 (A) NEGATIVE mg/dL   Protein, ur NEGATIVE NEGATIVE mg/dL   Nitrite NEGATIVE NEGATIVE   Leukocytes, UA SMALL (A) NEGATIVE  Urinalysis, Microscopic (reflex)     Status: Abnormal   Collection Time: 09/06/17 10:49 PM  Result Value Ref Range   RBC / HPF 0-5 0 - 5 RBC/hpf   WBC, UA 11-20 0 - 5 WBC/hpf   Bacteria, UA MANY (  A) NONE SEEN   Squamous Epithelial / LPF >50 0 - 5   Mucus PRESENT     Imaging Studies: Ct Head Wo Contrast  Result Date: 09/07/2017 CLINICAL DATA:  Acute onset of headache, nausea and vomiting. EXAM: CT HEAD WITHOUT CONTRAST TECHNIQUE: Contiguous axial images were obtained from the base of the skull through the vertex without intravenous contrast. COMPARISON:  None. FINDINGS: Brain: No evidence of acute infarction, hemorrhage, hydrocephalus, extra-axial collection or mass lesion/mass effect. The posterior fossa, including the cerebellum, brainstem and fourth ventricle, is within normal limits. The third and lateral ventricles, and basal ganglia are unremarkable in appearance. The cerebral hemispheres are symmetric in appearance, with normal gray-white differentiation. No mass effect or midline shift is seen. Vascular: No hyperdense vessel or unexpected calcification. Skull: There is no evidence of fracture; visualized osseous structures are unremarkable in appearance. Sinuses/Orbits: The orbits are within normal limits. The paranasal sinuses and mastoid air cells are well-aerated. Other: No significant soft tissue abnormalities are seen. IMPRESSION: Unremarkable noncontrast CT of the head. Electronically Signed   By: Roanna Raider  M.D.   On: 09/07/2017 00:21   Ct Abdomen Pelvis W Contrast  Result Date: 09/07/2017 CLINICAL DATA:  Acute onset of nausea and vomiting. Epigastric abdominal pain. EXAM: CT ABDOMEN AND PELVIS WITH CONTRAST TECHNIQUE: Multidetector CT imaging of the abdomen and pelvis was performed using the standard protocol following bolus administration of intravenous contrast. CONTRAST:  ISOVUE-300 IOPAMIDOL (ISOVUE-300) INJECTION 61% COMPARISON:  CT of the abdomen and pelvis, and abdominal ultrasound, performed 11/19/2014 FINDINGS: Lower chest: The visualized lung bases are grossly clear. The visualized portions of the mediastinum are unremarkable. Hepatobiliary: The liver is unremarkable in appearance. The patient is status post cholecystectomy, with clips noted at the gallbladder fossa. The common bile duct remains normal in caliber. Pancreas: The pancreas is within normal limits. Spleen: The spleen is unremarkable in appearance. Adrenals/Urinary Tract: The adrenal glands are unremarkable in appearance. The kidneys are within normal limits. There is no evidence of hydronephrosis. No renal or ureteral stones are identified. No perinephric stranding is seen. Stomach/Bowel: There appears to be mild wall thickening about the cecum and ascending colon, and about the splenic flexure of the colon, concerning for infectious or inflammatory colitis. Minimal inflammation extends about the appendix, but it is otherwise unremarkable. The stomach is grossly unremarkable in appearance. The small bowel is within normal limits. Vascular/Lymphatic: Scattered calcification is seen along the abdominal aorta and its branches. The abdominal aorta is otherwise grossly unremarkable. The inferior vena cava is grossly unremarkable. No retroperitoneal lymphadenopathy is seen. No pelvic sidewall lymphadenopathy is identified. Reproductive: The bladder is decompressed and not well characterized. The uterus is grossly unremarkable. The ovaries  are relatively symmetric. No suspicious adnexal masses are seen. Other: No additional soft tissue abnormalities are seen. Musculoskeletal: No acute osseous abnormalities are identified. The visualized musculature is unremarkable in appearance. IMPRESSION: Mild wall thickening about the cecum and ascending colon, and about the splenic flexure of the colon, concerning for infectious or inflammatory colitis. Aortic Atherosclerosis (ICD10-I70.0). Electronically Signed   By: Roanna Raider M.D.   On: 09/07/2017 00:30   12:55 AM Patient's headache and nausea improved.  She is still having some epigastric abdominal pain.  Her abdomen is soft with some epigastric and right-sided tenderness.  She was advised of CT findings suggesting colitis.  She continues to deny any diarrhea or abnormality of the stools.  We will treat her with Bactrim and have her return for  diarrhea, especially bloody diarrhea, or worsening abdominal pain.     Nishan Ovens, Jonny RuizJohn, MD 09/07/17 [phone]

## 2017-09-08 DIAGNOSIS — R112 Nausea with vomiting, unspecified: Secondary | ICD-10-CM | POA: Diagnosis not present

## 2017-09-08 DIAGNOSIS — N3000 Acute cystitis without hematuria: Secondary | ICD-10-CM | POA: Diagnosis not present

## 2017-09-08 DIAGNOSIS — F419 Anxiety disorder, unspecified: Secondary | ICD-10-CM | POA: Diagnosis not present

## 2017-09-08 LAB — URINE CULTURE: CULTURE: NO GROWTH

## 2018-02-16 DIAGNOSIS — G8929 Other chronic pain: Secondary | ICD-10-CM | POA: Diagnosis not present

## 2018-02-16 DIAGNOSIS — G479 Sleep disorder, unspecified: Secondary | ICD-10-CM | POA: Diagnosis not present

## 2018-02-16 DIAGNOSIS — E782 Mixed hyperlipidemia: Secondary | ICD-10-CM | POA: Diagnosis not present

## 2018-02-16 DIAGNOSIS — M419 Scoliosis, unspecified: Secondary | ICD-10-CM | POA: Diagnosis not present

## 2018-02-16 DIAGNOSIS — Z720 Tobacco use: Secondary | ICD-10-CM | POA: Diagnosis not present

## 2018-02-16 DIAGNOSIS — M5410 Radiculopathy, site unspecified: Secondary | ICD-10-CM | POA: Diagnosis not present

## 2018-02-16 DIAGNOSIS — F419 Anxiety disorder, unspecified: Secondary | ICD-10-CM | POA: Diagnosis not present

## 2018-02-16 DIAGNOSIS — Z1239 Encounter for other screening for malignant neoplasm of breast: Secondary | ICD-10-CM | POA: Diagnosis not present

## 2018-02-16 DIAGNOSIS — Z23 Encounter for immunization: Secondary | ICD-10-CM | POA: Diagnosis not present

## 2018-02-17 ENCOUNTER — Other Ambulatory Visit: Payer: Self-pay | Admitting: Physician Assistant

## 2018-02-17 DIAGNOSIS — Z1231 Encounter for screening mammogram for malignant neoplasm of breast: Secondary | ICD-10-CM

## 2018-08-17 DIAGNOSIS — G629 Polyneuropathy, unspecified: Secondary | ICD-10-CM | POA: Diagnosis not present

## 2018-08-17 DIAGNOSIS — F419 Anxiety disorder, unspecified: Secondary | ICD-10-CM | POA: Diagnosis not present

## 2018-08-17 DIAGNOSIS — G479 Sleep disorder, unspecified: Secondary | ICD-10-CM | POA: Diagnosis not present

## 2018-08-17 DIAGNOSIS — M549 Dorsalgia, unspecified: Secondary | ICD-10-CM | POA: Diagnosis not present

## 2018-08-17 DIAGNOSIS — G8929 Other chronic pain: Secondary | ICD-10-CM | POA: Diagnosis not present

## 2018-08-17 DIAGNOSIS — E785 Hyperlipidemia, unspecified: Secondary | ICD-10-CM | POA: Diagnosis not present

## 2018-10-30 DIAGNOSIS — H6121 Impacted cerumen, right ear: Secondary | ICD-10-CM | POA: Diagnosis not present

## 2018-10-30 DIAGNOSIS — R1032 Left lower quadrant pain: Secondary | ICD-10-CM | POA: Diagnosis not present

## 2018-12-21 DIAGNOSIS — Z23 Encounter for immunization: Secondary | ICD-10-CM | POA: Diagnosis not present

## 2018-12-21 DIAGNOSIS — G8929 Other chronic pain: Secondary | ICD-10-CM | POA: Diagnosis not present

## 2018-12-21 DIAGNOSIS — Z1231 Encounter for screening mammogram for malignant neoplasm of breast: Secondary | ICD-10-CM | POA: Diagnosis not present

## 2018-12-21 DIAGNOSIS — M5410 Radiculopathy, site unspecified: Secondary | ICD-10-CM | POA: Diagnosis not present

## 2018-12-21 DIAGNOSIS — M419 Scoliosis, unspecified: Secondary | ICD-10-CM | POA: Diagnosis not present

## 2018-12-21 DIAGNOSIS — M549 Dorsalgia, unspecified: Secondary | ICD-10-CM | POA: Diagnosis not present

## 2018-12-23 ENCOUNTER — Other Ambulatory Visit (HOSPITAL_BASED_OUTPATIENT_CLINIC_OR_DEPARTMENT_OTHER): Payer: Self-pay | Admitting: Physician Assistant

## 2018-12-23 DIAGNOSIS — Z1231 Encounter for screening mammogram for malignant neoplasm of breast: Secondary | ICD-10-CM

## 2020-04-28 DIAGNOSIS — F411 Generalized anxiety disorder: Secondary | ICD-10-CM | POA: Diagnosis not present

## 2020-04-28 DIAGNOSIS — E785 Hyperlipidemia, unspecified: Secondary | ICD-10-CM | POA: Diagnosis not present

## 2020-04-28 DIAGNOSIS — G4700 Insomnia, unspecified: Secondary | ICD-10-CM | POA: Diagnosis not present

## 2020-04-28 DIAGNOSIS — G8929 Other chronic pain: Secondary | ICD-10-CM | POA: Diagnosis not present

## 2020-04-28 DIAGNOSIS — F4321 Adjustment disorder with depressed mood: Secondary | ICD-10-CM | POA: Diagnosis not present

## 2020-06-02 DIAGNOSIS — Z124 Encounter for screening for malignant neoplasm of cervix: Secondary | ICD-10-CM | POA: Diagnosis not present

## 2020-06-02 DIAGNOSIS — Z0189 Encounter for other specified special examinations: Secondary | ICD-10-CM | POA: Diagnosis not present

## 2020-06-02 DIAGNOSIS — Z136 Encounter for screening for cardiovascular disorders: Secondary | ICD-10-CM | POA: Diagnosis not present

## 2020-06-02 DIAGNOSIS — Z131 Encounter for screening for diabetes mellitus: Secondary | ICD-10-CM | POA: Diagnosis not present

## 2020-06-02 DIAGNOSIS — R300 Dysuria: Secondary | ICD-10-CM | POA: Diagnosis not present

## 2020-06-02 DIAGNOSIS — E785 Hyperlipidemia, unspecified: Secondary | ICD-10-CM | POA: Diagnosis not present

## 2021-12-31 ENCOUNTER — Other Ambulatory Visit: Payer: Self-pay | Admitting: Family Medicine

## 2021-12-31 DIAGNOSIS — R1013 Epigastric pain: Secondary | ICD-10-CM

## 2023-03-11 ENCOUNTER — Other Ambulatory Visit: Payer: Self-pay | Admitting: Family Medicine

## 2023-03-11 DIAGNOSIS — Z1231 Encounter for screening mammogram for malignant neoplasm of breast: Secondary | ICD-10-CM
